# Patient Record
Sex: Female | Born: 1990 | Race: White | Hispanic: No | Marital: Married | Smoking: Former smoker
Health system: Southern US, Community
[De-identification: ages and names within clinical notes are randomized; demographics above are authoritative.]

## PROBLEM LIST (undated history)

## (undated) ENCOUNTER — Encounter

## (undated) ENCOUNTER — Ambulatory Visit

## (undated) ENCOUNTER — Telehealth

## (undated) ENCOUNTER — Telehealth: Attending: Sports Medicine | Primary: Sports Medicine

## (undated) ENCOUNTER — Encounter: Payer: BLUE CROSS/BLUE SHIELD | Attending: Sports Medicine | Primary: Sports Medicine

## (undated) ENCOUNTER — Encounter: Payer: PRIVATE HEALTH INSURANCE | Attending: Sports Medicine | Primary: Sports Medicine

## (undated) ENCOUNTER — Encounter: Attending: Adult Health | Primary: Adult Health

## (undated) DIAGNOSIS — K219 Gastro-esophageal reflux disease without esophagitis: Secondary | ICD-10-CM

## (undated) DIAGNOSIS — M7650 Patellar tendinitis, unspecified knee: Secondary | ICD-10-CM

## (undated) DIAGNOSIS — N7011 Chronic salpingitis: Secondary | ICD-10-CM

## (undated) HISTORY — PX: NO PAST SURGERIES: SHX2092

## (undated) HISTORY — DX: Patellar tendinitis, unspecified knee: M76.50

---

## 2014-01-12 ENCOUNTER — Encounter: Payer: Self-pay | Admitting: Nurse Practitioner

## 2014-01-12 ENCOUNTER — Ambulatory Visit (INDEPENDENT_AMBULATORY_CARE_PROVIDER_SITE_OTHER): Payer: Self-pay

## 2014-01-12 ENCOUNTER — Telehealth: Payer: Self-pay | Admitting: Family Medicine

## 2014-01-12 ENCOUNTER — Ambulatory Visit (INDEPENDENT_AMBULATORY_CARE_PROVIDER_SITE_OTHER): Payer: Self-pay | Admitting: Nurse Practitioner

## 2014-01-12 ENCOUNTER — Encounter (INDEPENDENT_AMBULATORY_CARE_PROVIDER_SITE_OTHER): Payer: Self-pay

## 2014-01-12 VITALS — BP 130/82 | HR 99 | Temp 100.1°F | Ht 65.0 in | Wt 212.0 lb

## 2014-01-12 DIAGNOSIS — S93401A Sprain of unspecified ligament of right ankle, initial encounter: Secondary | ICD-10-CM

## 2014-01-12 DIAGNOSIS — M25579 Pain in unspecified ankle and joints of unspecified foot: Secondary | ICD-10-CM

## 2014-01-12 DIAGNOSIS — S93409A Sprain of unspecified ligament of unspecified ankle, initial encounter: Secondary | ICD-10-CM

## 2014-01-12 NOTE — Telephone Encounter (Signed)
There are no available appointments today and xray services aren't available tomorrow. Patient is self pay and spoke with the billing department about the estimated cost. She called urgent care and they are more expensive.   She has tried RICE as much as possible but hasn't been able to stay off of it a lot the past 2 days. She complains of swelling, pain, and bruising that is worsening.  Asked patient to come over and we will work her in.

## 2014-01-12 NOTE — Addendum Note (Signed)
Addended by: Bennie PieriniMARTIN, MARY-MARGARET on: 01/12/2014 05:01 PM   Modules accepted: Level of Service

## 2014-01-12 NOTE — Progress Notes (Signed)
   Subjective:    Patient ID: Erika ChartersRachel White, female    DOB: [birth year], 23 y.o.   MRN: [id number]  HPI Patient fell out of her [employer]ep Last Friday- Swollen and pain since then- No better.    Review of Systems  Constitutional: Negative.   HENT: Negative.   Respiratory: Negative.   Musculoskeletal: Positive for joint swelling (right ankle).  All other systems reviewed and are negative.      Objective:   Physical Exam  Constitutional: She appears well-developed and well-nourished.  Cardiovascular: Normal rate, regular rhythm and normal heart sounds.   Pulmonary/Chest: Effort normal and breath sounds normal.  Musculoskeletal:  Decreased ROM of right ankle due pain on inversion and eversion. Mild edema bil    BP 130/82  Pulse 99  Temp(Src) 100.1 F (37.8 C) (Oral)  Ht 5\' 5"  (1.651 m)  Wt 212 lb (96.163 kg)  BMI 35.28 kg/m2  Right ankle x ray- negative no fracture-Preliminary reading by Paulene FloorMary Canyon Willow, FNP  [facility]        Assessment & Plan:   1. Pain in joint, ankle and foot   2. Right ankle sprain   motrin OTC Ice as needed Elevate when sitting Ankle brace when up on ankle RTOI prn  Mary-Margaret Daphine DeutscherMartin, FNP

## 2014-01-12 NOTE — Patient Instructions (Signed)

## 2014-03-15 ENCOUNTER — Telehealth: Payer: Self-pay | Admitting: Nurse Practitioner

## 2014-03-15 NOTE — Telephone Encounter (Signed)
Patient aware that we do not have labs and xray on sat.  Patient told that if she starts having any chest pain, sob that she needs to be evaluated by ER.

## 2014-05-31 ENCOUNTER — Ambulatory Visit: Payer: BC Managed Care – PPO

## 2014-05-31 ENCOUNTER — Ambulatory Visit (INDEPENDENT_AMBULATORY_CARE_PROVIDER_SITE_OTHER): Payer: BC Managed Care – PPO

## 2014-05-31 ENCOUNTER — Ambulatory Visit (INDEPENDENT_AMBULATORY_CARE_PROVIDER_SITE_OTHER): Payer: BC Managed Care – PPO | Admitting: Family Medicine

## 2014-05-31 ENCOUNTER — Encounter: Payer: Self-pay | Admitting: Family Medicine

## 2014-05-31 ENCOUNTER — Encounter (INDEPENDENT_AMBULATORY_CARE_PROVIDER_SITE_OTHER): Payer: Self-pay

## 2014-05-31 VITALS — BP 114/75 | HR 85 | Temp 98.9°F | Wt 220.4 lb

## 2014-05-31 DIAGNOSIS — M25562 Pain in left knee: Principal | ICD-10-CM

## 2014-05-31 DIAGNOSIS — M25569 Pain in unspecified knee: Secondary | ICD-10-CM

## 2014-05-31 DIAGNOSIS — M25561 Pain in right knee: Secondary | ICD-10-CM

## 2014-05-31 NOTE — Assessment & Plan Note (Addendum)
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 No siginificant arthritis noted but some loss of joint space medially on L.  Catching and giving out hx concerning for possible ligament injury/tear.  Pt to start ibuprofen/aleve, knee exercises, ice.  F/u in 4 wks. If not better consider steorid injections w/ PT.

## 2014-05-31 NOTE — Patient Instructions (Signed)
There is no significant evidence of arthritis.  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 Please start the exercises below Come back in 4 weeks if you are not better to discuss other options such as physical therapy and an injection  Excessive Lateral Patellar Compression Syndrome with Rehab Excessive lateral patellar compression syndrome involves an increase in pressure in the knee joint, that causes pain. The kneecap (patella) is a v-shaped bone that usually glides through the center of the groove in the thigh bone (trochlea). For people with this condition, the kneecap presses more on the outer (lateral) side of the groove, causing an increase in pressure and pain in the knee. This condition usually occurs without injury, although it may also follow an injury. SYMPTOMS   General, spread out knee pain, most commonly in the front half of the knee, behind the kneecap, or in the very back of the knee. Less commonly, the pain may be above or below the kneecap.  Pain that gets worse with sitting for long periods, rising from a sitting position, going up or down stairs or hills, kneeling, squatting, or wearing shoes with heels.  Often, pain with jumping.  Often, an aching pain.  Giving way, catching of the knee.  Minimal or no swelling, no locking. CAUSES  Excessive lateral patellar compression syndrome is caused by weakness in the thigh (quadriceps) muscles, that causes the kneecap to track poorly within the knee joint. The poor tracking of the kneecap may also occur in people who have an improper alignment of the knee and leg (bowlegged, knock knees). The poor tracking of the kneecap results in an increase in pressure on the outer side of the knee. The membrane that holds the kneecap in place (retinaculum) is stretched, which causes pain. The pain gets worse with use of the thigh (quadriceps)  muscle. RISK INCREASES WITH:  Tight back of the thigh (hamstring), front of the thigh (quadriceps), or calf muscles.  Weak front of the thigh (quadriceps) muscles.  Failure to warm up properly before activity.  Sports that involve running, jumping, or squatting.  Poor alignment of the legs (bowlegged, knock knees).  Born with (congenital) malformation of the kneecap or thigh bone groove (trochlea).  Previous injury or surgery to the knee.  Direct injury to the kneecap (falling on the kneecap). PREVENTION   Warm up and stretch properly before activity.  Maintain physical fitness:  Strength, flexibility, and endurance.  Cardiovascular fitness.  Use arch supports (orthotics) or knee pads. PROGNOSIS  If treated properly, excessive lateral patellar compression syndrome is usually curable. If left untreated, the condition does not usually result in a permanent condition. RELATED COMPLICATIONS   Frequently recurring symptoms and disability, severe enough to decrease an athlete's competitive ability.  Arthritis of the kneecap.  Kneecap dislocations.  Risks of surgery: infection, bleeding, injury to nerves (numbness, weakness, paralysis), knee stiffness, dislocation of the kneecap, weakness, continued pain, compartment syndrome (if surgery is performed to cut the bone of the leg and move it). TREATMENT  Treatment first involves ice and medicine, to reduce pain and inflammation. Since weak muscles often cause the condition, it is important to complete strengthening and stretching exercises to help the kneecap track properly in the thigh bone groove. These exercises may be performed at home or with a therapist. Your caregiver may recommend that you wear a knee brace, to help the kneecap track properly.  For people with flat feet, orthotics may be advised. If non-surgical treatment is unsuccessful, surgery may be needed. Surgery involves cutting the outer side (lateral release) of the  connecting band (retinaculum), with or without tightening the retinaculum on the inner side of the knee. Sometimes, surgery to cut the bony bump below the kneecap (tibial tubercle) and move it may be required (insertion of the patellar tendon into bone).  MEDICATION  If pain medicine is needed, nonsteroidal anti-inflammatory medicines (aspirin and ibuprofen), or other minor pain relievers (acetaminophen), are often advised.  Do not take pain medicine for 7 days before surgery.  Prescription pain relievers may be given, if your caregiver thinks they are needed. Use only as directed and only as much as you need.  Corticosteroid injections are rarely used for this condition. HEAT AND COLD  Cold treatment (icing) should be applied for 10 to 15 minutes every 2 to 3 hours for inflammation and pain, and immediately after activity that aggravates your symptoms. Use ice packs or an ice massage.  Heat treatment may be used before performing stretching and strengthening activities prescribed by your caregiver, physical therapist, or athletic trainer. Use a heat pack or a warm water soak. SEEK MEDICAL CARE IF:  Symptoms get worse or do not improve in 6 to 8 weeks, despite treatment.  Any of the following occur after surgery:  Pain, numbness, coldness, or discoloration (blue, gray, or dark color) in the foot.  Fever, increased pain, swelling, redness, drainage of fluids, or bleeding in the affected area.  New, unexplained symptoms develop. (Drugs used in treatment may produce side effects.) EXERCISES  RANGE OF MOTION (ROM) AND STRETCHING EXERCISES - Excessive Lateral Patellar Compression Syndrome These exercises may help you when beginning to rehabilitate your injury. Your symptoms may resolve with or without further involvement from your physician, physical therapist or athletic trainer. While completing these exercises, remember:   Restoring tissue flexibility helps normal motion to return to the  joints. This allows healthier, less painful movement and activity.  An effective stretch should be held for at least 30 seconds.  A stretch should never be painful. You should only feel a gentle lengthening or release in the stretched tissue. Inform your caregiver of any exercises that increase your knee discomfort. STRETCH - Lateral Patellar Mobilizations, Seated  While sitting, bend your knee 90 degrees or a little less. Place your foot flat on the floor.  Place the inside of your palm at the base of your thumb, on the inside border of your kneecap.  Press down on the inside border so that the outside border slightly lifts up.  You should feel a slight stretch on the outside edge of your kneecap. Hold this position for __________ seconds. Repeat __________ times. Complete this stretch __________ times per day.  STRETCH - Hamstrings, Standing  Stand or sit and extend your right / left leg, placing your foot on a chair or foot stool.  Keep a slight arch in your low back and your hips straight forward.  Lead with your chest and lean forward at the waist until you feel a gentle stretch in the back of your right / left knee or thigh. (When done correctly, this exercise requires leaning only a small distance.)  Hold this position for __________ seconds. Repeat __________ times. Complete this stretch __________ times per day. STRETCH - Quadriceps, Prone  Lie on your stomach on a firm surface, such as a bed or padded floor.  Bend your right / left  knee and grasp your ankle. If you are unable to reach your ankle or pant leg, use a belt around your foot to lengthen your reach.  Gently pull your heel toward your buttocks. Your knee should not slide out to the side. You should feel a stretch in the front of your thigh and knee.  Hold this position for __________ seconds. Repeat __________ times. Complete this stretch __________ times per day.  STRETCH - Iliotibial Band  On the floor or  bed, lie on your side so your right / left leg is on top. Bend your knee and grab your ankle.  Slowly bring your knee back so that your thigh is in line with your trunk. Keep your heel at your buttocks and gently arch your back so your head, shoulders and hips line up.  Slowly lower your leg so that your knee approaches the floor, until you feel a gentle stretch on the outside of your right / left thigh. If you do not feel a stretch and your knee will not fall farther, place the heel of your opposite foot on top of your knee and pull your thigh down farther.  Hold this stretch for __________ seconds. Repeat __________ times. Complete this stretch __________ times per day. STRENGTHENING EXERCISES - Excessive Lateral Patellar Compression Syndrome These exercises may help you when beginning to rehabilitate your injury. They may resolve your symptoms with or without further involvement from your physician, physical therapist or athletic trainer. While completing these exercises, remember:   Muscles can gain both the endurance and the strength needed for everyday activities through controlled exercises.  Complete these exercises as instructed by your physician, physical therapist or athletic trainer. Increase the resistance and repetitions only as guided.  Only do your exercises in a pain-free range of motion. If the exercises that involve bending your knees while bearing weight cause pain, stop them and consult your caregiver. STRENGTH - Quadriceps, Isometrics  Lie on your back with your right / left leg extended and your opposite knee bent.  Gradually tense the muscles in the front of your right / left thigh. You should see either your knee cap slide up toward your hip or increased dimpling just above the knee. This motion will push the back of the knee down toward the floor, mat, or bed on which you are lying.  Hold the muscle as tight as you can, without increasing your pain, for __________  seconds.  Relax the muscles slowly and completely between each repetition. Repeat __________ times. Complete this exercise __________ times per day.  STRENGTH - Quadriceps, Short Arcs  Lie on your back. Place a __________ inch towel roll under your right / left knee, so that the knee bends slightly.  Raise only your lower leg, by tightening the muscles in the front of your thigh. Do not allow your thigh to rise.  Hold this position for __________ seconds. Repeat __________ times. Complete this exercise __________ times per day.  OPTIONAL ANKLE WEIGHTS: Begin with ____________________, but DO NOT exceed ____________________. Increase in 1 pound/0.5 kilogram increments. STRENGTH - Quadriceps, Straight Leg Raises Quality counts! Watch for signs that the quadriceps muscle is working, to be sure you are strengthening the correct muscles and not "cheating" by substituting with healthier muscles.  Lay on your back with your right / left leg extended and your opposite knee bent.  Tense the muscles in the front of your right / left thigh. You should see either your knee cap slide up  or increased dimpling just above the knee. Your thigh may even shake a bit.  Tighten these muscles even more and raise your leg 4 to 6 inches off the floor. Hold for __________ seconds.  Keeping these muscles tense, lower your leg.  Relax the muscles slowly and completely in between each repetition. Repeat __________ times. Complete this exercise __________ times per day.  STRENGTH - Quadriceps, Wall Slides Follow guidelines for form closely. Increased knee pain often results from poorly placed feet or knees.  Lean against a smooth wall or door, and walk your feet out 18-24 inches. Place your feet hip width apart.  Slowly slide down the wall or door until your knees bend __________ degrees.* Keep your knees over your heels, not your toes, and in line with your hips, not falling to either side.  Hold for __________  seconds. Stand up to rest for __________ seconds between each repetition. Repeat __________ times. Complete this exercise __________ times per day. * Your physician, physical therapist or athletic trainer will alter this angle based on your symptoms and progress. STRENGTH - Quadriceps, Squats  Stand in a door frame, so that your feet and knees are in line with the frame.  Use your hands for balance, not support, on the frame.  Slowly lower your weight, bending at the hips and knees. Keep your lower legs upright so that they are parallel with the door frame. Squat only within the range that does not increase your knee pain. Never let your hips drop below your knees.  Slowly return upright, pushing with your legs, not pulling with your hands. Repeat __________ times. Complete this exercise __________ times per day.  STRENGTH - Quadriceps, Step-Ups   Use a thick book, step or step stool that is __________ inches tall.  Hold a wall or counter for balance only, not support.  Slowly, step up with your right / left foot, keeping your knee in line with your hip and foot. Do not allow your knee to bend so far that you cannot see your toes.  Slowly unlock your knee and lower yourself to the starting position. Your muscles, not gravity, should lower you. Repeat __________ times. Complete this exercise __________ times per day.  STRENGTH - Quadriceps, Step-Downs  Stand on the edge of a step stool or stair. Be prepared to use a countertop or wall for balance, if needed.  Keeping your right / left knee directly over the middle of your foot, slowly touch your opposite heel to the floor or lower step. Do not go all the way to the floor if your knee pain increases. Just go as far as you can without increased discomfort. Use your right / left leg muscles, not gravity, to lower your body weight.  Slowly push your body weight back up to the starting position, Repeat __________ times. Complete this exercise  __________ times per day.  STRENGTH - Quad/VMO, Isometric  Sit in a chair with your right / left knee slightly bent. With your fingertips, feel the VMO muscle, just above the inside of your knee. The VMO is important in controlling the position of your kneecap.  Keep your fingertips on this muscle. Without actually moving your leg, attempt to drive your knee down, as if straightening your leg. You should feel your VMO tense. If you have a difficult time, you may wish to try the same exercise on your healthy knee first.  Tense this muscle as hard as you can without increasing any knee pain.  Hold for __________ seconds. Relax the muscles slowly and completely between each repetition. Repeat __________ times. Complete exercise __________ times per day.  Document Released: 09/21/2005 Document Revised: 12/14/2011 Document Reviewed: 01/03/2009 [facility] Patient Information 2015 [place], [place]. This information is not intended to replace advice given to you by your health care provider. Make sure you discuss any questions you have with your health care provider.    Patellofemoral Syndrome If you have had pain in the front of your knee for a long time, chances are good that you have patellofemoral syndrome. The word patella refers to the kneecap. Femoral (or femur) refers to the thigh bone. That is the bone the kneecap sits on. The kneecap is shaped like a triangle. Its job is to protect the knee and to improve the efficiency of your thigh muscles (quadriceps). The underside of the kneecap is made of smooth tissue (cartilage). This lets the kneecap slide up and down as the knee moves. Sometimes this cartilage becomes soft. Your healthcare provider may say the cartilage breaks down. That is patellofemoral syndrome. It can affect one knee, or both. The condition is sometimes called patellofemoral pain syndrome. That is because the condition is painful. The pain usually gets worse with activity. Sitting for a  long time with the knee bent also makes the pain worse. It usually gets better with rest and proper treatment. CAUSES  No one is sure why some people develop this problem and others do not. Runners often get it. One name for the condition is "runner's knee." However, some people run for years and never have knee pain. Certain things seem to make patellofemoral syndrome more likely. They include:  Moving out of alignment. The kneecap is supposed to move in a straight line when the thigh muscle pulls on it. Sometimes the kneecap moves in poor alignment. That can make the knee swell and hurt. Some experts believe it also wears down the cartilage.  Injury to the kneecap.  Strain on the knee. This may occur during sports activity. Soccer, running, skiing and cycling can put excess stress on the knee.  Being flat-footed or knock-kneed. SYMPTOMS   Knee pain.  Pain under the kneecap. This is usually a dull, aching pain.  Pain in the knee when doing certain things: squatting, kneeling, going up or down stairs.  Pain in the knee when you stand up after sitting down for awhile.  Tightness in the knee.  Loss of muscle strength in the thigh.  Swelling of the knee. DIAGNOSIS  Healthcare providers often send people with knee pain to an orthopedic caregiver. This person has special training to treat problems with bones and joints. To decide what is causing your knee pain, your caregiver will probably:  Do a physical exam. This will probably include:  Asking about symptoms you have noticed.  Asking about your activities and any injuries.  Feeling your knee. Moving it. This will help test the knee's strength. It will also check alignment (whether the knee and leg are aligned normally).  Order some tests, such as:  Imaging tests. They create pictures of the inside of the knee. Tests may include:  X-rays.  Computed tomography (CT) scan. This uses X-rays and a computer to show more  detail.  Magnetic resonance imaging (MRI). This test uses magnets, radio waves and a computer to make pictures. TREATMENT   Medication is almost always used first. It can relieve pain. It also can reduce swelling. Non-steroidal anti-inflammatory medicines (called NSAIDs) are usually suggested.  Sometimes a stronger form is needed. A stronger form would require a prescription.  Other treatment may be needed after the swelling goes down. Possibilities include:  Exercise. Certain exercises can make the muscles around the knee stronger which decreases the pressure on the knee cap. This includes the thigh muscle. Certain exercises also may be suggested to increase your flexibility.  A knee brace. This gives the knee extra support and helps align the movement of the knee cap.  Orthotics. These are special shoe inserts. They can help keep your leg and knee aligned.  Surgery is sometimes needed. This is rare. Options include:  Arthroscopy. The surgeon uses a special tool to remove any damaged pieces of the kneecap. Only a few small incisions (cuts) are needed.  Realignment. This is open surgery. The goals are to reduce pressure and fix the way the kneecap moves. HOME CARE INSTRUCTIONS   Take any medication prescribed by your healthcare provider. Follow the directions carefully.  If your knee is swollen:  Put ice or cold packs on it. Do this for 20 to 30 minutes, 3 to 4 times a day.  Keep the knee raised. Make sure it is supported. Put a pillow under it.  Rest your knee. For example, take the elevator instead of the stairs for awhile. Or, take a break from sports activity that strain your knee. Try walking or swimming instead.  Whenever you are active:  Use an elastic bandage on your knee. This gives it support.  After any activity, put ice or cold packs on your knees. Do this for about 10 to 20 minutes.  Make sure you wear shoes that give good support. Make sure they are not worn down.  The heels should not slant in or out. SEEK MEDICAL CARE IF:   Knee pain gets worse. Or it does not go away, even after taking pain medicine.  Swelling does not go down.  Your thigh muscle becomes weak.  You have an oral temperature above 102 F (38.9 C). SEEK IMMEDIATE MEDICAL CARE IF:  You have an oral temperature above 102 F (38.9 C), not controlled by medicine. Document Released: 09/09/2009 Document Revised: 12/14/2011 Document Reviewed: 12/11/2013 [facility] Patient Information 2015 [place], [place]. This information is not intended to replace advice given to you by your health care provider. Make sure you discuss any questions you have with your health care provider.

## 2014-05-31 NOTE — Progress Notes (Signed)
Erika White is a 23 y.o. female who presents to [facility] today for knee pain  Knee pain: started initially 1 year ago. R>L. Getting worse since starting at [facility]. Pt has gained wt over the past couple of years but has stopped smoking. No specific trauma but did play a lot of softball over the years. Played as pitcher and outfield. Occasionally ppoping nad crunching. Occasionally the R gives out on pt. Worse w/ prolonged walking. Improves w/ rest and NSAIDs.   The following portions of the patient's history were reviewed and updated as appropriate: allergies, current medications, past medical history, family and social history, and problem list.  Patient is a nonsmoker.  Past Medical History  Diagnosis Date  . Patellar tendonitis     bilateral    ROS as above otherwise neg.    Medications reviewed. No current outpatient prescriptions on file.   No current facility-administered medications for this visit.    Exam:  BP 114/75  Pulse 85  Temp(Src) 98.9 F (37.2 C) (Oral)  Wt 220 lb 6.4 oz (99.973 kg)  LMP 05/24/2014 Gen: Well NAD HEENT: EOMI,  MMM MSK: FROm knees bilat. Strength 5/5. Mild crepitus. Lachman's neg bilat. Valgus and varrus stresses w/o pain. Thessaly test mildly positive bilat.    No results found for this or any previous visit (from the past 72 hour(s)).  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 No siginificant arthritis noted but some loss of joint space medially on L.  Catching and giving out hx concerning for possible ligament injury/tear.  Pt to start ibuprofen/aleve, knee exercises, ice.  F/u in 4 wks. If not better consider steorid injections w/ PT.

## 2014-07-05 ENCOUNTER — Encounter: Payer: Self-pay | Admitting: *Deleted

## 2014-07-05 ENCOUNTER — Encounter: Payer: Self-pay | Admitting: Family Medicine

## 2014-07-05 ENCOUNTER — Ambulatory Visit (INDEPENDENT_AMBULATORY_CARE_PROVIDER_SITE_OTHER): Payer: BC Managed Care – PPO | Admitting: Family Medicine

## 2014-07-05 VITALS — BP 119/69 | HR 89 | Temp 99.4°F | Ht 65.0 in | Wt 222.0 lb

## 2014-07-05 DIAGNOSIS — H8113 Benign paroxysmal vertigo, bilateral: Secondary | ICD-10-CM

## 2014-07-05 DIAGNOSIS — N390 Urinary tract infection, site not specified: Secondary | ICD-10-CM

## 2014-07-05 DIAGNOSIS — K2100 Gastro-esophageal reflux disease with esophagitis, without bleeding: Secondary | ICD-10-CM

## 2014-07-05 DIAGNOSIS — H8110 Benign paroxysmal vertigo, unspecified ear: Secondary | ICD-10-CM

## 2014-07-05 DIAGNOSIS — N912 Amenorrhea, unspecified: Secondary | ICD-10-CM

## 2014-07-05 DIAGNOSIS — R350 Frequency of micturition: Secondary | ICD-10-CM

## 2014-07-05 LAB — POCT URINALYSIS DIPSTICK
Bilirubin, UA: NEGATIVE
Glucose, UA: NEGATIVE
Ketones, UA: NEGATIVE
Nitrite, UA: NEGATIVE
Spec Grav, UA: 1.025
Urobilinogen, UA: NEGATIVE
pH, UA: 5

## 2014-07-05 LAB — POCT URINE PREGNANCY: Preg Test, Ur: NEGATIVE

## 2014-07-05 LAB — POCT UA - MICROSCOPIC ONLY
Casts, Ur, LPF, POC: NEGATIVE
Crystals, Ur, HPF, POC: NEGATIVE
Mucus, UA: NEGATIVE
Yeast, UA: NEGATIVE

## 2014-07-05 MED ORDER — OMEPRAZOLE 20 MG PO CPDR: 20.0000 mg | DELAYED_RELEASE_CAPSULE | Freq: Every day | ORAL | Status: DC

## 2014-07-05 MED ORDER — MECLIZINE HCL 25 MG PO TABS: 25.0000 mg | ORAL_TABLET | Freq: Three times a day (TID) | ORAL | Status: DC | PRN

## 2014-07-05 MED ORDER — METHYLPREDNISOLONE ACETATE 80 MG/ML IJ SUSP
80.0000 mg | Freq: Once | INTRAMUSCULAR | Status: AC
  Administered 2014-07-05: 80 mg via INTRAMUSCULAR

## 2014-07-05 MED ORDER — CIPROFLOXACIN HCL 500 MG PO TABS: 500.0000 mg | ORAL_TABLET | Freq: Two times a day (BID) | ORAL | Status: DC

## 2014-07-06 NOTE — Progress Notes (Signed)
   Subjective:    Patient ID: Rosalyn ChartersRachel Evans, female    DOB: 06/14/1991, 23 y.o.   MRN: [id number]  HPI C/o nausea, dizziness, abdominal discomfort, GERD sx's, urinary frequency, and fatigue.  She has amenorrhea and she states she has hx of irregular menses.     Review of Systems C/o GERD, abdominal pain, N/V, amenorrhea, and urinary freq. No chest pain, SOB, HA, dizziness, vision change,=[diarrhea, constipation,  myalgias, arthralgias or rash.     Objective:   Physical Exam  Vital signs noted  Well developed well nourished female.  HEENT - Head atraumatic Normocephalic                Eyes - PERRLA, Conjuctiva - clear Sclera- Clear EOMI                Ears - EAC's Wnl TM's Wnl Gross Hearing WNL                Nose - Nares patent                 Throat - oropharanx wnl Respiratory - Lungs CTA bilateral Cardiac - RRR S1 and S2 without murmur GI - Abdomen soft Nontender and bowel sounds active x 4 Extremities - No edema. Neuro - Grossly intact.      Assessment & Plan:  Urinary frequency - Plan: POCT UA - Microscopic Only, POCT urine pregnancy, POCT urinalysis dipstick, Urine culture  Amenorrhea - Plan: POCT UA - Microscopic Only, POCT urine pregnancy, POCT urinalysis dipstick  Benign paroxysmal positional vertigo, bilateral - Plan: meclizine (ANTIVERT) 25 MG tablet  Urinary tract infection without hematuria, site unspecified - Plan: ciprofloxacin (CIPRO) 500 MG tablet, Urine culture  Gastroesophageal reflux disease with esophagitis - Plan: omeprazole (PRILOSEC) 20 MG capsule  Benign paroxysmal positional vertigo, unspecified laterality - Plan: meclizine (ANTIVERT) 25 MG tablet, methylPREDNISolone acetate (DEPO-MEDROL) injection 80 mg  Deatra CanterWilliam J Oxford FNP

## 2014-07-07 LAB — URINE CULTURE

## 2014-07-24 ENCOUNTER — Telehealth: Payer: Self-pay | Admitting: Family Medicine

## 2014-07-24 NOTE — Telephone Encounter (Signed)
Follow up.

## 2014-07-25 NOTE — Telephone Encounter (Signed)
Appointment scheduled to follow up with provider.

## 2014-07-27 ENCOUNTER — Telehealth: Payer: Self-pay | Admitting: Family Medicine

## 2014-07-27 ENCOUNTER — Encounter: Payer: Self-pay | Admitting: Family Medicine

## 2014-07-27 ENCOUNTER — Ambulatory Visit (INDEPENDENT_AMBULATORY_CARE_PROVIDER_SITE_OTHER): Payer: BC Managed Care – PPO | Admitting: Family Medicine

## 2014-07-27 VITALS — BP 108/67 | HR 80 | Temp 99.8°F | Ht 65.0 in | Wt 218.8 lb

## 2014-07-27 DIAGNOSIS — R1013 Epigastric pain: Secondary | ICD-10-CM

## 2014-07-27 DIAGNOSIS — N912 Amenorrhea, unspecified: Secondary | ICD-10-CM

## 2014-07-27 NOTE — Progress Notes (Signed)
   Subjective:    Patient ID: Erika White, female    DOB: 1990/11/18, 23 y.o.   MRN: [id number]  HPI She is having persistent nausea and amenorrhea.  She has lower abdominal discomfort.  Her LMP was 8/15. She had negative pregnancy test UA last week.  Review of Systems  Constitutional: Negative for fever.  HENT: Negative for ear pain.   Eyes: Negative for discharge.  Respiratory: Negative for cough.   Cardiovascular: Negative for chest pain.  Gastrointestinal: Positive for abdominal pain. Negative for abdominal distention.  Endocrine: Negative for polyuria.  Genitourinary: Positive for menstrual problem. Negative for difficulty urinating.  Musculoskeletal: Negative for gait problem and neck pain.  Skin: Negative for color change and rash.  Neurological: Negative for speech difficulty and headaches.  Psychiatric/Behavioral: Negative for agitation.       Objective:    BP 108/67  Pulse 80  Temp(Src) 99.8 F (37.7 C) (Oral)  Ht 5\' 5"  (1.651 m)  Wt 218 lb 12.8 oz (99.247 kg)  BMI 36.41 kg/m2  LMP 05/19/2014 Physical Exam  Constitutional: She is oriented to person, place, and time. She appears well-developed and well-nourished.  HENT:  Head: Normocephalic and atraumatic.  Mouth/Throat: Oropharynx is clear and moist.  Eyes: Pupils are equal, round, and reactive to light.  Neck: Normal range of motion. Neck supple.  Cardiovascular: Normal rate and regular rhythm.   No murmur heard. Pulmonary/Chest: Effort normal and breath sounds normal.  Abdominal: Soft. Bowel sounds are normal. There is tenderness.  Neurological: She is alert and oriented to person, place, and time.  Skin: Skin is warm and dry.  Psychiatric: She has a normal mood and affect.          Assessment & Plan:     ICD-9-CM ICD-10-CM   1. Amenorrhea 626.0 N91.2 Ambulatory referral to Obstetrics / Gynecology  2. Epigastric pain 789.06 R10.13 Ambulatory referral to Gastroenterology     Return if symptoms  worsen or fail to improve.  Deatra CanterWilliam J Lillyian Heidt FNP

## 2014-08-02 ENCOUNTER — Ambulatory Visit: Payer: Self-pay | Admitting: Physician Assistant

## 2016-05-14 ENCOUNTER — Telehealth: Payer: Self-pay | Admitting: Family Medicine

## 2016-05-14 NOTE — Telephone Encounter (Signed)
LM to set up apt with Dr. Nelson °

## 2016-06-01 ENCOUNTER — Encounter: Payer: Self-pay | Admitting: Family Medicine

## 2016-06-01 ENCOUNTER — Encounter (INDEPENDENT_AMBULATORY_CARE_PROVIDER_SITE_OTHER): Payer: Self-pay

## 2016-06-01 ENCOUNTER — Ambulatory Visit (HOSPITAL_COMMUNITY)
Admission: RE | Admit: 2016-06-01 | Discharge: 2016-06-01 | Disposition: A | Discharge status: Home / Self Care | Payer: BLUE CROSS/BLUE SHIELD | Source: Ambulatory Visit | Attending: Family Medicine | Admitting: Family Medicine

## 2016-06-01 ENCOUNTER — Ambulatory Visit (INDEPENDENT_AMBULATORY_CARE_PROVIDER_SITE_OTHER): Payer: BLUE CROSS/BLUE SHIELD | Admitting: Family Medicine

## 2016-06-01 VITALS — BP 106/74 | HR 70 | Resp 18 | Ht 65.0 in | Wt 202.0 lb

## 2016-06-01 DIAGNOSIS — L659 Nonscarring hair loss, unspecified: Secondary | ICD-10-CM

## 2016-06-01 DIAGNOSIS — Z7189 Other specified counseling: Secondary | ICD-10-CM

## 2016-06-01 DIAGNOSIS — R05 Cough: Secondary | ICD-10-CM | POA: Diagnosis not present

## 2016-06-01 DIAGNOSIS — R0789 Other chest pain: Secondary | ICD-10-CM | POA: Insufficient documentation

## 2016-06-01 DIAGNOSIS — Z8249 Family history of ischemic heart disease and other diseases of the circulatory system: Secondary | ICD-10-CM

## 2016-06-01 DIAGNOSIS — N979 Female infertility, unspecified: Secondary | ICD-10-CM | POA: Diagnosis not present

## 2016-06-01 DIAGNOSIS — Z91048 Other nonmedicinal substance allergy status: Secondary | ICD-10-CM

## 2016-06-01 DIAGNOSIS — R079 Chest pain, unspecified: Secondary | ICD-10-CM | POA: Diagnosis not present

## 2016-06-01 DIAGNOSIS — Z23 Encounter for immunization: Secondary | ICD-10-CM

## 2016-06-01 DIAGNOSIS — Z7689 Persons encountering health services in other specified circumstances: Secondary | ICD-10-CM

## 2016-06-01 DIAGNOSIS — Z9109 Other allergy status, other than to drugs and biological substances: Secondary | ICD-10-CM | POA: Insufficient documentation

## 2016-06-01 NOTE — Progress Notes (Signed)
Chief Complaint  Patient presents with  . Establish Care    previous pcp Dr. Darlyn Read with [facility]    Here to establish Healthy 25 year old, married.  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 No health problems or ongoing issues Has had knee pain int he past attributed to patellofemoral disease Eats well and feels like she exercises Worried about gaining weight and hair loss.  Thinks may have "hormone imbalance." She also has not used birth control in 5 years and expresses concern they are not pregnant. Health maintenance up to date Her mother has pulmonary fibrosis with double lung transplants, she has persistent  R lung pain that comes and goes for months PAP 2015, normal Will get a flu shot today No recent labs or PE  Patient Active Problem List   Diagnosis Date Noted  . Environmental allergies 06/01/2016  . Bilateral knee pain 05/31/2014   No current outpatient prescriptions on file prior to visit.   No current facility-administered medications on file prior to visit.    History reviewed. No pertinent surgical history. Family History  Problem Relation Age of Onset  . Thyroid disease Mother   . Pulmonary fibrosis Mother     lung transplants  . Heart disease Father 45    heart disease in his 56s  . Heart attack Father   . Diabetes Father   . Cancer Maternal Grandmother     lung and breast  . Cancer Maternal Grandfather   . Cancer Paternal Grandmother     mesothelioma  . Cancer Paternal Grandfather     lung   Review of Systems  Constitutional: Negative for chills, fever and weight loss.  HENT: Negative for congestion and hearing loss.   Eyes: Negative for blurred vision and pain.  Respiratory: Negative for cough and shortness of breath.   Cardiovascular: Negative for chest pain and leg swelling.       Right posterior chest wall pain at times  Gastrointestinal: Negative for abdominal pain, constipation, diarrhea and heartburn.   Genitourinary: Negative for dysuria and frequency.  Musculoskeletal: Negative for falls, joint pain and myalgias.  Neurological: Negative for dizziness, seizures and headaches.  Psychiatric/Behavioral: Negative for depression. The patient is not nervous/anxious and does not have insomnia.    Physical Exam  Constitutional: She is oriented to person, place, and time. She appears well-developed and well-nourished. No distress.  Mod overweight  HENT:  Head: Normocephalic and atraumatic.  Right Ear: External ear normal.  Left Ear: External ear normal.  Mouth/Throat: Oropharynx is clear and moist.  Eyes: Conjunctivae are normal. Pupils are equal, round, and reactive to light.  Neck: Normal range of motion. Neck supple.  Cardiovascular: Normal rate, regular rhythm and normal heart sounds.   Pulmonary/Chest: Effort normal and breath sounds normal.  Abdominal: Soft. Bowel sounds are normal.  Musculoskeletal: Normal range of motion. She exhibits no edema.  No CVAT or back tenderness  Neurological: She is alert and oriented to person, place, and time.  Cannot get DTRs, dull  Skin: Skin is warm and dry. No rash noted.  Psychiatric: She has a normal mood and affect. Her behavior is normal. Judgment and thought content normal.   ASSESSMENT AND PLAN:  1. Encounter to establish care with new doctor   2. Environmental allergies CONTROLLED with OTC  3. Loss of hair  - TSH  4. Infertility, female Refer to GYN - COMPLETE METABOLIC PANEL WITH GFR - TSH - CBC  5. Family history of  premature coronary heart disease  - Lipid Profile  6. Right-sided chest wall pain Discussed likely musculoskeletal or chest wall pain, will get CXR for reassurance and because of persistent SX - DG Chest 2 View; Future  Patient Instructions  Chest x ray ordered Need fasting lab work I will notify you of your test results I will refer to appropriate specialist See me after testing

## 2016-06-01 NOTE — Patient Instructions (Signed)
Chest x ray ordered Need fasting lab work I will notify you of your test results I will refer to appropriate specialist See me after testing

## 2016-06-03 ENCOUNTER — Other Ambulatory Visit (HOSPITAL_COMMUNITY)
Admission: RE | Admit: 2016-06-03 | Discharge: 2016-06-03 | Disposition: A | Discharge status: Home / Self Care | Payer: BLUE CROSS/BLUE SHIELD | Source: Ambulatory Visit | Attending: Family Medicine | Admitting: Family Medicine

## 2016-06-03 ENCOUNTER — Encounter: Payer: Self-pay | Admitting: Family Medicine

## 2016-06-03 DIAGNOSIS — L659 Nonscarring hair loss, unspecified: Secondary | ICD-10-CM | POA: Insufficient documentation

## 2016-06-03 DIAGNOSIS — N979 Female infertility, unspecified: Secondary | ICD-10-CM | POA: Diagnosis not present

## 2016-06-03 DIAGNOSIS — Z8249 Family history of ischemic heart disease and other diseases of the circulatory system: Secondary | ICD-10-CM | POA: Diagnosis not present

## 2016-06-03 LAB — CBC
HEMATOCRIT: 41.6 % (ref 36.0–46.0)
Hemoglobin: 14.2 g/dL (ref 12.0–15.0)
MCH: 28.8 pg (ref 26.0–34.0)
MCHC: 34.1 g/dL (ref 30.0–36.0)
MCV: 84.4 fL (ref 78.0–100.0)
Platelets: 317 10*3/uL (ref 150–400)
RBC: 4.93 MIL/uL (ref 3.87–5.11)
RDW: 13.2 % (ref 11.5–15.5)
WBC: 8.9 10*3/uL (ref 4.0–10.5)

## 2016-06-03 LAB — COMPREHENSIVE METABOLIC PANEL
ALT: 21 U/L (ref 14–54)
ANION GAP: 7 (ref 5–15)
AST: 21 U/L (ref 15–41)
Albumin: 4.9 g/dL (ref 3.5–5.0)
Alkaline Phosphatase: 46 U/L (ref 38–126)
BILIRUBIN TOTAL: 1.5 mg/dL — AB (ref 0.3–1.2)
BUN: 11 mg/dL (ref 6–20)
CHLORIDE: 107 mmol/L (ref 101–111)
CO2: 23 mmol/L (ref 22–32)
Calcium: 9 mg/dL (ref 8.9–10.3)
Creatinine, Ser: 0.58 mg/dL (ref 0.44–1.00)
Glucose, Bld: 102 mg/dL — ABNORMAL HIGH (ref 65–99)
POTASSIUM: 4.1 mmol/L (ref 3.5–5.1)
Sodium: 137 mmol/L (ref 135–145)
TOTAL PROTEIN: 7.9 g/dL (ref 6.5–8.1)

## 2016-06-03 LAB — LIPID PANEL
CHOL/HDL RATIO: 3.2 ratio
Cholesterol: 165 mg/dL (ref 0–200)
HDL: 51 mg/dL (ref >40)
LDL CALC: 86 mg/dL (ref 0–99)
TRIGLYCERIDES: 140 mg/dL (ref <150)
VLDL: 28 mg/dL (ref 0–40)

## 2016-06-03 LAB — TSH: TSH: 2.03 u[IU]/mL (ref 0.350–4.500)

## 2016-06-05 ENCOUNTER — Other Ambulatory Visit: Payer: Self-pay | Admitting: Family Medicine

## 2016-06-05 DIAGNOSIS — N979 Female infertility, unspecified: Secondary | ICD-10-CM

## 2016-06-25 IMAGING — CR DG KNEE STANDING AP BILAT
3 series · 3 of 3 positions shown · non-contrast
Comparison: None.

CLINICAL DATA: Bilateral knee pain

EXAM:
BILATERAL KNEES STANDING - 1 VIEW

[view not recorded (1 of 3)]
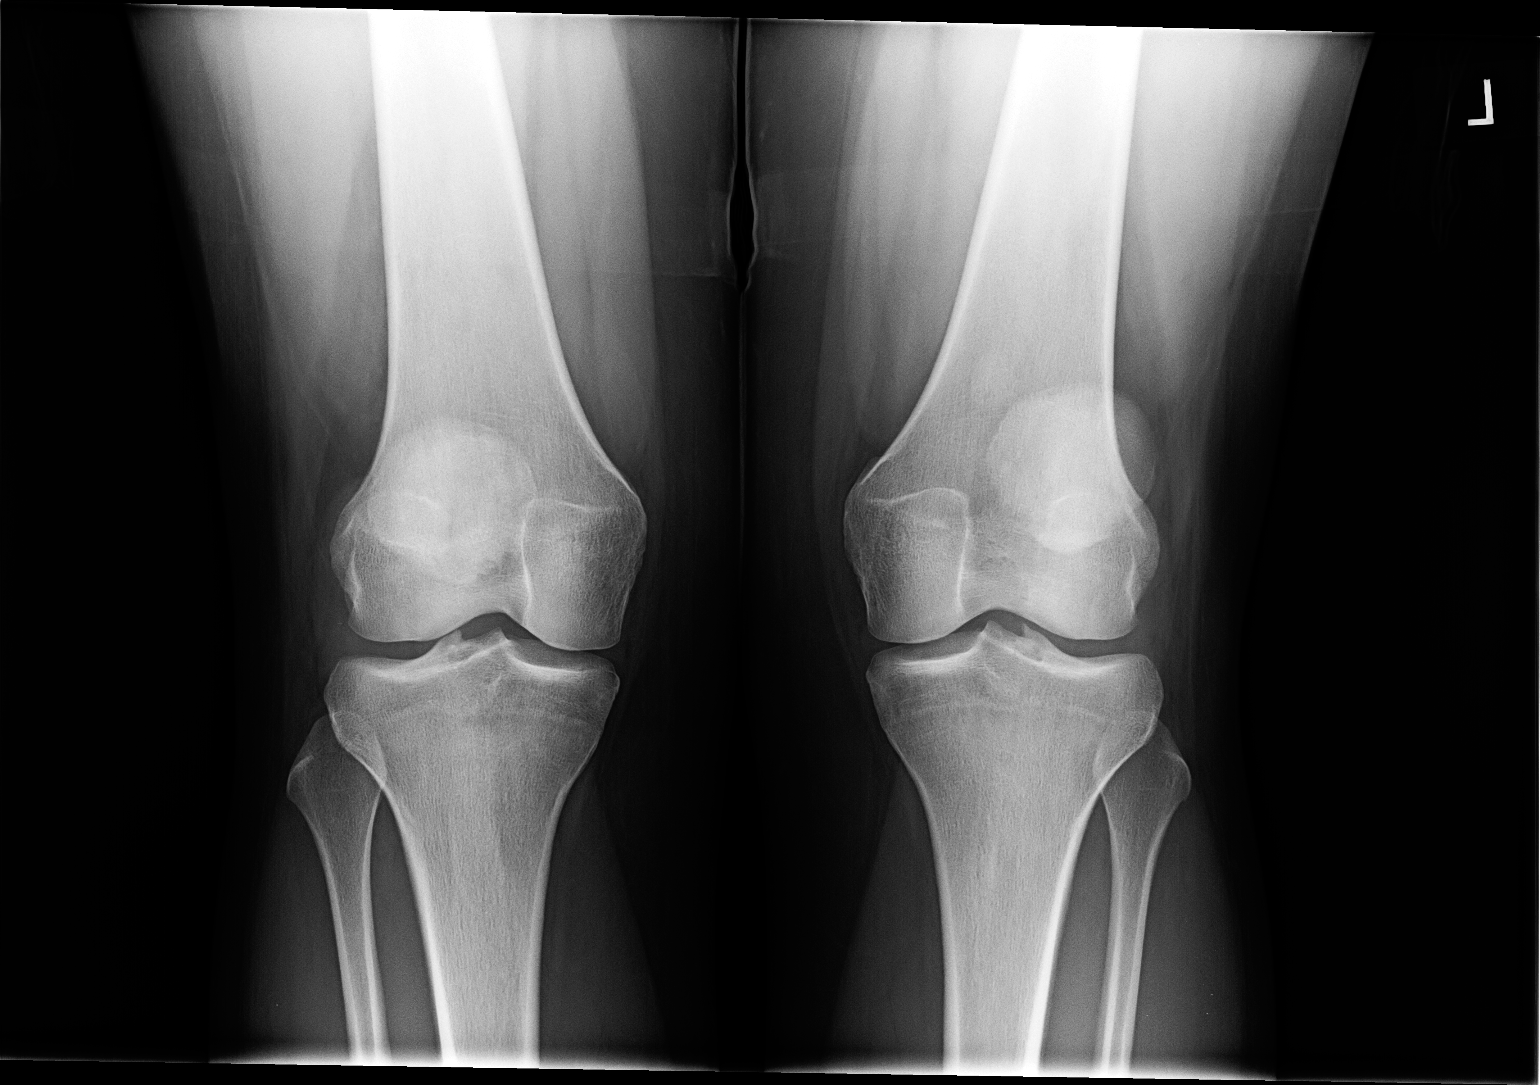

[view not recorded (2 of 3)]
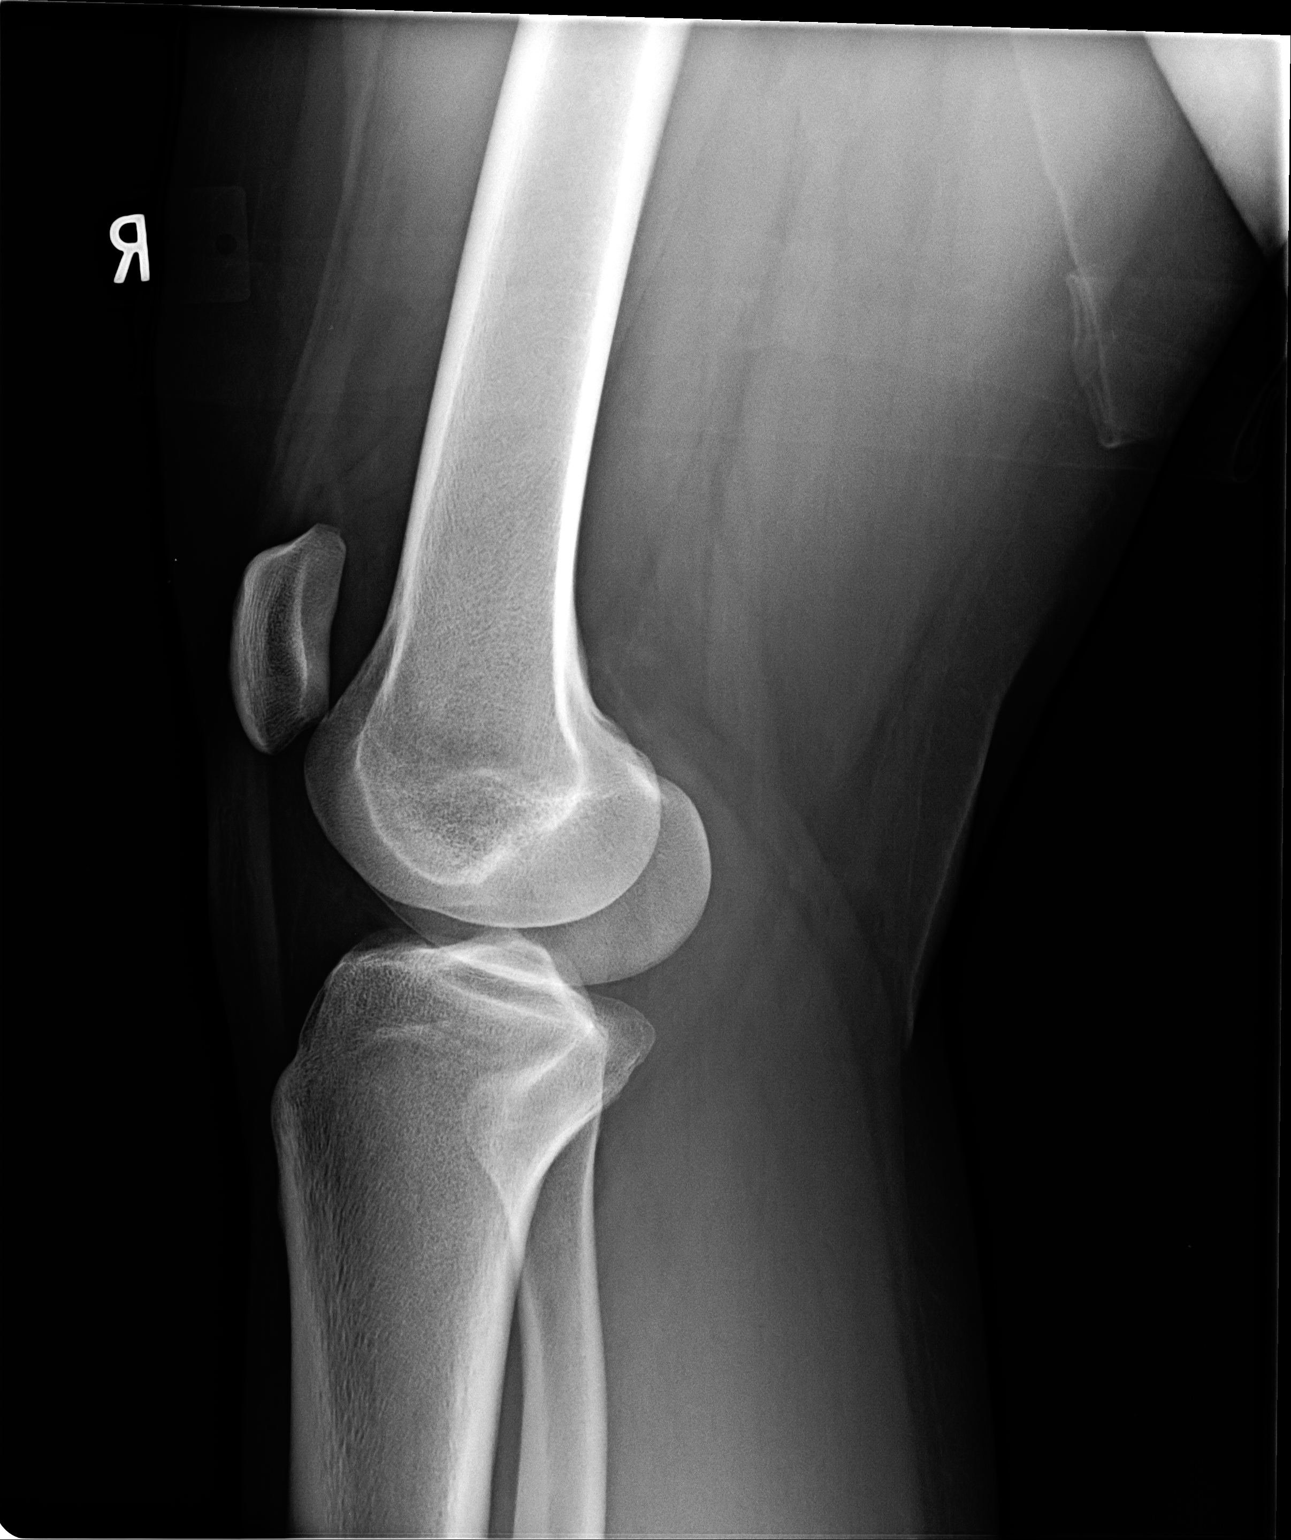

[view not recorded (3 of 3)]
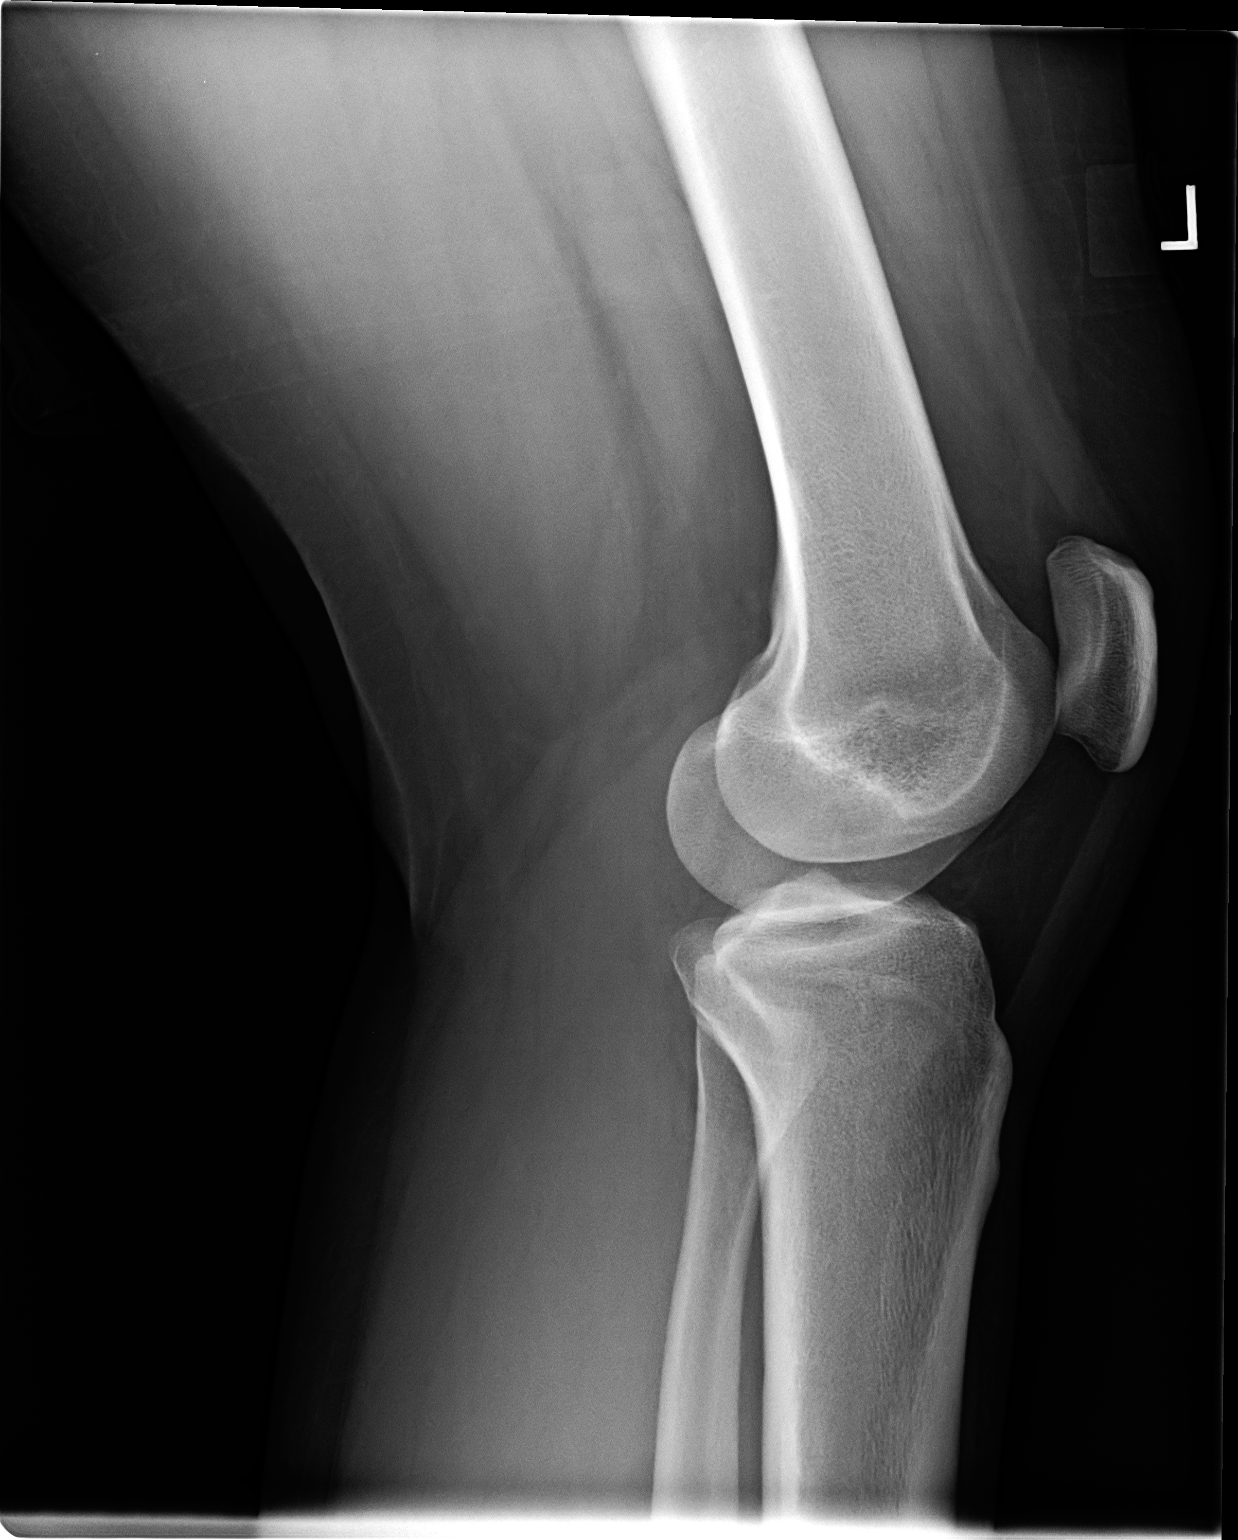

[3 of 3 positions shown; findings below may reference images not displayed]

FINDINGS: There is no evidence of fracture, dislocation, or joint effusion.
There is no evidence of arthropathy or other focal bone abnormality.
Soft tissues are unremarkable.
IMPRESSION: Negative.

## 2016-06-29 ENCOUNTER — Ambulatory Visit (INDEPENDENT_AMBULATORY_CARE_PROVIDER_SITE_OTHER): Payer: BLUE CROSS/BLUE SHIELD | Admitting: Women's Health

## 2016-06-29 ENCOUNTER — Encounter: Payer: Self-pay | Admitting: Women's Health

## 2016-06-29 ENCOUNTER — Telehealth: Payer: Self-pay | Admitting: *Deleted

## 2016-06-29 VITALS — BP 108/60 | HR 72 | Ht 65.0 in | Wt 207.0 lb

## 2016-06-29 DIAGNOSIS — N979 Female infertility, unspecified: Secondary | ICD-10-CM | POA: Diagnosis not present

## 2016-06-29 DIAGNOSIS — L659 Nonscarring hair loss, unspecified: Secondary | ICD-10-CM | POA: Diagnosis not present

## 2016-06-29 NOTE — Telephone Encounter (Signed)
Mailbox full @ 5:26 pm. JSY

## 2016-06-29 NOTE — Progress Notes (Signed)
   [facility] Visit  Patient name: Erika HartsRachel A White MRN [id number]  Date of birth: 02-02-1991  CC & HPI:  Erika HartsRachel A White is a 25 y.o. G0 Caucasian female presenting today for report of hair loss that began earlier this year. Did start fixing/doing more w/ her hair around this time. Notices on brush and in shower. No noticeable hair loss on scalp. Married x 2765yrs, husband is 25yo-no children, she has never been on contraception, not pregnant yet.  Regular periods, x 5d heavy then lighter, +cramping. May miss 1 period/yr, ~1wk late about twice/yr. Does want to be pregnant. Denies bad acne, occ bump. No recent unexplained weight gain.  Patient's last menstrual period was 06/09/2016. The current method of family planning is none. Last pap 2015, normal  Pertinent History Reviewed:  Medical & Surgical Hx:   Past medical, surgical, family, and social history reviewed in electronic medical record Medications: Reviewed & Updated - see associated section Allergies: Reviewed in electronic medical record  Objective Findings:  Vitals: BP 108/60   Pulse 72   Ht 5\' 5"  (1.651 m)   Wt 207 lb (93.9 kg)   LMP 06/09/2016   BMI 34.45 kg/m  Body mass index is 34.45 kg/m.  Physical Examination: General appearance - alert, well appearing, and in no distress Skin - no evidence of acanthosis nigricans Hair- hair wrapped in turban- pt declines to take off, but reports no patches of alopecia/receding hair line/visible hair loss  No results found for this or any previous visit (from the past 24 hour(s)).   Assessment & Plan:  A:   Hair loss  Infertility  P:  A1C today  Call back when period starts to schedule pelvic u/s when period stops, then f/u w/ me  Pt asking about semen analysis- called [facility], they do not do here or at [facility], she will call and let me know where it can be done  Had normal cbc, cmp, tsh w/ pcp 06/03/16  No Follow-up on file.  Marge DuncansBooker, Kashana Breach Randall CNM,  [facility] Campus[facility]NP-BC 06/29/2016 2:36 PM

## 2016-06-29 NOTE — Patient Instructions (Signed)
Call us when period starts to schedule pelvic ultrasound for when period stops

## 2016-06-30 LAB — HEMOGLOBIN A1C
Est. average glucose Bld gHb Est-mCnc: 97 mg/dL
HEMOGLOBIN A1C: 5 % (ref 4.8–5.6)

## 2016-06-30 NOTE — Telephone Encounter (Signed)
Pt called the after hours nurse line and got advise. Encounter closed. JSY

## 2016-07-02 ENCOUNTER — Telehealth: Payer: Self-pay | Admitting: Women's Health

## 2016-07-02 NOTE — Telephone Encounter (Signed)
Pt informed of A1C of 5.0 from 06/29/2016. Pt verbalized understanding.

## 2016-07-08 ENCOUNTER — Telehealth: Payer: Self-pay | Admitting: *Deleted

## 2016-07-08 NOTE — Telephone Encounter (Signed)
Pt was unsure when she needed to schedule her U/S for, she started her period today.  Spoke with Joellyn HaffKim Booker and informed pt to schedule it for when she would be ending her period, her periods usually last 5 days to told her to schedule it for Monday.  Pt verbalized understanding and call transferred to front staff for appointment to be made.

## 2016-07-09 ENCOUNTER — Ambulatory Visit (INDEPENDENT_AMBULATORY_CARE_PROVIDER_SITE_OTHER): Payer: BLUE CROSS/BLUE SHIELD | Admitting: Family Medicine

## 2016-07-09 ENCOUNTER — Encounter: Payer: Self-pay | Admitting: Family Medicine

## 2016-07-09 VITALS — BP 120/78 | HR 82 | Ht 65.0 in | Wt 209.1 lb

## 2016-07-09 DIAGNOSIS — L500 Allergic urticaria: Secondary | ICD-10-CM | POA: Diagnosis not present

## 2016-07-09 MED ORDER — METHYLPREDNISOLONE 4 MG PO TBPK: ORAL_TABLET | ORAL | 0 refills | Status: DC

## 2016-07-09 NOTE — Patient Instructions (Signed)
Take the prednisone (medrol)  as directed  Take claritin or zyrtec ( generic) once a day in the morning Take a benadryl 25 to 50 mg at night  Call if not better by Monday

## 2016-07-09 NOTE — Progress Notes (Signed)
Chief Complaint  Patient presents with  . Rash    itchy hives x 3 days. started on her lower leg where she thought she was bitten by a horsefly, then moved to abdomen and arms and her eyes have been itching as well    Patient states that she has terrible itchy rash present for about 3 days. Started on her right lower leg. her chest, abdomen, and right arm. no shortness of breath. she thinks this might have started with with an insect bite. She has no known allergies. She's never had hives before. We discussed hives. We reviewed medications, supplements, foods, food allergies. We discussed soaps lotions powders and other products. I told her that often she is 1 idiopathic and we are not able to identify the cause. No throat or mouth swelling, no shortness of breath no systemic symptoms   Patient Active Problem List   Diagnosis Date Noted  . Allergic urticaria 07/09/2016  . Infertility, female 06/29/2016  . Hair loss 06/29/2016  . Environmental allergies 06/01/2016  . Bilateral knee pain 05/31/2014    Outpatient Encounter Prescriptions as of 07/09/2016  Medication Sig  . methylPREDNISolone (MEDROL DOSEPAK) 4 MG TBPK tablet Take all of day one today.  TAD   No facility-administered encounter medications on file as of 07/09/2016.     No Known Allergies  Review of Systems  Constitutional: Negative for chills and fever.  HENT: Negative for congestion, trouble swallowing and voice change.   Eyes: Negative for redness and itching.  Respiratory: Negative for chest tightness, shortness of breath and wheezing.   Cardiovascular: Negative for chest pain, palpitations and leg swelling.  Gastrointestinal: Negative for diarrhea, nausea and vomiting.  Genitourinary: Negative for difficulty urinating and dysuria.  Musculoskeletal: Negative.  Negative for arthralgias and myalgias.  Skin: Positive for rash.  Neurological: Negative.  Negative for headaches.  Psychiatric/Behavioral: Negative.   The patient is not nervous/anxious.   All other systems reviewed and are negative.  BP 120/78   Pulse 82   Ht 5\' 5"  (1.651 m)   Wt 209 lb 1.9 oz (94.9 kg)   LMP 06/09/2016   SpO2 97%   BMI 34.80 kg/m   Physical Exam  Constitutional: She is oriented to person, place, and time. She appears well-developed and well-nourished. No distress.  HENT:  Head: Normocephalic and atraumatic.  Right Ear: External ear normal.  Left Ear: External ear normal.  Mouth/Throat: Oropharynx is clear and moist.  Eyes: Conjunctivae are normal. Pupils are equal, round, and reactive to light.  Neck: Normal range of motion.  Cardiovascular: Normal rate, regular rhythm and normal heart sounds.   Pulmonary/Chest: Effort normal and breath sounds normal. She has no wheezes. She has no rales.  Abdominal: Soft. Bowel sounds are normal. She exhibits no distension.  Musculoskeletal: Normal range of motion.  Lymphadenopathy:    She has no cervical adenopathy.  Neurological: She is alert and oriented to person, place, and time.  Skin:  Large irregular patch on right anterior shin, clear center. Multiple wheals on abdomen, some circular with clear centers. Scattered patches on right breast and right bicep  Psychiatric: She has a normal mood and affect. Her behavior is normal.   ASSESSMENT/PLAN:  1. Allergic urticaria Unclear etiology   Patient Instructions  Take the prednisone (medrol)  as directed  Take claritin or zyrtec ( generic) once a day in the morning Take a benadryl 25 to 50 mg at night  Call if not better by Monday  Raylene Everts, MD

## 2016-07-10 ENCOUNTER — Other Ambulatory Visit: Payer: Self-pay | Admitting: Women's Health

## 2016-07-10 DIAGNOSIS — N979 Female infertility, unspecified: Secondary | ICD-10-CM

## 2016-07-13 ENCOUNTER — Ambulatory Visit (INDEPENDENT_AMBULATORY_CARE_PROVIDER_SITE_OTHER): Payer: BLUE CROSS/BLUE SHIELD

## 2016-07-13 ENCOUNTER — Telehealth: Payer: Self-pay | Admitting: Women's Health

## 2016-07-13 DIAGNOSIS — N854 Malposition of uterus: Secondary | ICD-10-CM

## 2016-07-13 DIAGNOSIS — N83291 Other ovarian cyst, right side: Secondary | ICD-10-CM | POA: Diagnosis not present

## 2016-07-13 DIAGNOSIS — N979 Female infertility, unspecified: Secondary | ICD-10-CM | POA: Diagnosis not present

## 2016-07-13 NOTE — Progress Notes (Signed)
PELVIC US TA/TV: Homogeneous retroverted uterus,wnl,EEC 5.8 cm,simple right paraovarian cyst 2.7 x 1.3 x 1.2 cm,normal lt ov

## 2016-07-20 ENCOUNTER — Ambulatory Visit (INDEPENDENT_AMBULATORY_CARE_PROVIDER_SITE_OTHER): Payer: BLUE CROSS/BLUE SHIELD | Admitting: Women's Health

## 2016-07-20 ENCOUNTER — Encounter: Payer: Self-pay | Admitting: Women's Health

## 2016-07-20 VITALS — BP 108/60 | HR 76 | Wt 211.0 lb

## 2016-07-20 DIAGNOSIS — N979 Female infertility, unspecified: Secondary | ICD-10-CM

## 2016-07-20 DIAGNOSIS — N83201 Unspecified ovarian cyst, right side: Secondary | ICD-10-CM

## 2016-07-20 DIAGNOSIS — L732 Hidradenitis suppurativa: Secondary | ICD-10-CM | POA: Diagnosis not present

## 2016-07-20 DIAGNOSIS — Z319 Encounter for procreative management, unspecified: Secondary | ICD-10-CM | POA: Diagnosis not present

## 2016-07-20 NOTE — Telephone Encounter (Signed)
Pt is scheduled to see Joellyn HaffKim Booker today and will discuss at that time.

## 2016-07-20 NOTE — Patient Instructions (Addendum)
[facility] [place] [phone]   . Loose, light clothing. Avoid heat, friction, shearing. Don't squeeze! . Wash clothes in perfume/dye free detergent . Gentle non-soap cleanser, wash gently w/ fingers (No cloth, loofah), can use antibacterial cleanser . Stop smoking! . Weight loss . Decrease/no dairy   Hidradenitis Suppurativa Hidradenitis suppurativa is a long-term (chronic) skin disease that starts with blocked sweat glands or hair follicles. Bacteria may grow in these blocked openings of your skin. Hidradenitis suppurativa is like a severe form of acne that develops in areas of your body where acne would be unusual. It is most likely to affect the areas of your body where skin rubs against skin and becomes moist. This includes your:  Underarms.  Groin.  Genital areas.  Buttocks.  Upper thighs.  Breasts. Hidradenitis suppurativa may start out with small pimples. The pimples can develop into deep sores that break open (rupture) and drain pus. Over time your skin may thicken and become scarred. Hidradenitis suppurativa cannot be passed from person to person.  CAUSES  The exact cause of hidradenitis suppurativa is not known. This condition may be due to:  Female and female hormones. The condition is rare before and after puberty.  An overactive body defense system (immune system). Your immune system may overreact to the blocked hair follicles or sweat glands and cause swelling and pus-filled sores. RISK FACTORS You may have a higher risk of hidradenitis suppurativa if you:  Are a woman.  Are between ages 2211 and 3755.  Have a family history of hidradenitis suppurativa.  Have a personal history of acne.  Are overweight.  Smoke.  Take the drug lithium. SIGNS AND SYMPTOMS  The first signs of an outbreak are usually painful skin bumps that look like pimples. As the condition progresses:  Skin bumps may get bigger and grow deeper into the skin.  Bumps under the  skin may rupture and drain smelly pus.  Skin may become itchy and infected.  Skin may thicken and scar.  Drainage may continue through tunnels under the skin (fistulas).  Walking and moving your arms can become painful. DIAGNOSIS  Your health care provider may diagnose hidradenitis suppurativa based on your medical history and your signs and symptoms. A physical exam will also be done. You may need to see a health care provider who specializes in skin diseases (dermatologist). You may also have tests done to confirm the diagnosis. These can include:  Swabbing a sample of pus or drainage from your skin so it can be sent to the lab and tested for infection.  Blood tests to check for infection. TREATMENT  The same treatment will not work for everybody with hidradenitis suppurativa. Your treatment will depend on how severe your symptoms are. You may need to try several treatments to find what works best for you. Part of your treatment may include cleaning and bandaging (dressing) your wounds. You may also have to take medicines, such as the following:  Antibiotics.  Acne medicines.  Medicines to block or suppress the immune system.  A diabetes medicine (metformin) is sometimes used to treat this condition.  For women, birth control pills can sometimes help relieve symptoms. You may need surgery if you have a severe case of hidradenitis suppurativa that does not respond to medicine. Surgery may involve:   Using a laser to clear the skin and remove hair follicles.  Opening and draining deep sores.  Removing the areas of skin that are diseased and scarred. HOME CARE INSTRUCTIONS  Learn  as much as you can about your disease, and work closely with your health care providers.  Take medicines only as directed by your health care provider.  If you were prescribed an antibiotic medicine, finish it all even if you start to feel better.  If you are overweight, losing weight may be very  helpful. Try to reach and maintain a healthy weight.  Do not use any tobacco products, including cigarettes, chewing tobacco, or electronic cigarettes. If you need help quitting, ask your health care provider.  Do not shave the areas where you get hidradenitis suppurativa.  Do not wear deodorant.  Wear loose-fitting clothes.  Try not to overheat and get sweaty.  Take a daily bleach bath as directed by your health care provider.  Fill your bathtub halfway with water.  Pour in  cup of unscented household bleach.  Soak for 5-10 minutes.  Cover sore areas with a warm, clean washcloth (compress) for 5-10 minutes. SEEK MEDICAL CARE IF:   You have a flare-up of hidradenitis suppurativa.  You have chills or a fever.  You are having trouble controlling your symptoms at home.   This information is not intended to replace advice given to you by your health care provider. Make sure you discuss any questions you have with your health care provider.   Document Released: 05/05/2004 Document Revised: 10/12/2014 Document Reviewed: 12/22/2013 Elsevier Interactive Patient Education [employer]

## 2016-07-20 NOTE — Telephone Encounter (Signed)
Pt has an appointment with Joellyn HaffKim Booker, CNM today and will discuss at that time.

## 2016-07-20 NOTE — Progress Notes (Signed)
   [facility] Visit  Patient name: Erika HartsRachel A Miralles MRN [id number]  Date of birth: November 18, 1990  CC & HPI:  Erika White is a 25 y.o. G0 Hispanic female presenting today for f/u pelvic u/s done d/t infertility. U/s normal, no evidence of PCO. Has regular periods. Married x 4176yrs, husband is 25yo w/o children of his own, she has never been on contraception. Was interested in semen analysis for husband, per [facility] [place]- would probably be easier to have him see urologist.  Also reports recurrent bumps on mons, also gets them on inner thighs. None under breasts/axilla. Uses trimmer to shave pubic hair, so no direct contact w/ blade to skin.  Patient's last menstrual period was 07/08/2016 (exact date). The current method of family planning is none. Last pap 2015, neg  Pertinent History Reviewed:  Medical & Surgical Hx:   Past medical, surgical, family, and social history reviewed in electronic medical record Medications: Reviewed & Updated - see associated section Allergies: Reviewed in electronic medical record  Objective Findings:  Vitals: BP 108/60 (BP Location: Right Arm, Patient Position: Sitting, Cuff Size: Large)   Pulse 76   Wt 211 lb (95.7 kg)   LMP 07/08/2016 (Exact Date)   BMI 35.11 kg/m  Body mass index is 35.11 kg/m.  Pelvic u/s done 07/13/16:  Physical Examination: General appearance - alert, well appearing, and in no distress Skin - mons: ~1cm hard, non-erythematous, non-tender boil, no s/s infection  Erika HartsRachel A Baena is a 25 y.o. LMP 07/08/2016 for a pelvic sonogram for infertility,R/O PCOS.  Uterus                      6.9 x 5.9 x 4 cm,  homogeneous retroverted uterus,wnl Endometrium          5.8 mm, symmetrical, wnl Right ovary             3.8 x 1.7 x 1.4 cm, simple right paraovarian cyst 2.7 x 1.3 x 1.2 cm Left ovary                4.2 x 2.2 x 1.9 cm, wnl  Small amount of simple cul de sac fluid,no pain during ultrasound,ov's  appear mobile.  Technician Comments: PELVIC US TA/TV: Homogeneous retroverted uterus,wnl,EEC 5.8 cm,simple right paraovarian cyst 2.7 x 1.3 x 1.2 cm,normal lt ov Amber Flora LippsJ Carl 07/13/2016 2:49 PM  Clinical Impression and recommendations: I have reviewed the sonogram results above, combined with the patient's current clinical course, below are my impressions and any appropriate recommendations for management based on the sonographic findings. Normal uterus and endometrium ovaries are normal, do not have the classic appearance of PCOS ovaries Paraovarian cyst, clinicially insignificant  EURE,LUTHER H 07/15/2016 8:02 AM   Assessment & Plan:  A:   Infertility, unclear if etiology is her or husband, leaning more towards husband as her periods are regular, pelvic u/s normal, no evidence of PCO  Likely hidradenitis   P:  Have husband f/u w/ urologist for infertility work-up/semen analysis  If all normal w/ husband, make appt to f/u w/ me and we will further discuss options  Gave printed info/lifestyle changes for hidradenitis  Will need pap next year  No Follow-up on file.  Marge DuncansBooker, Kimberly Randall CNM, [facility]NP-BC 07/20/2016 3:53 PM

## 2017-06-22 ENCOUNTER — Telehealth: Payer: Self-pay | Admitting: Family Medicine

## 2017-06-22 NOTE — Telephone Encounter (Signed)
lmtcb

## 2017-06-22 NOTE — Telephone Encounter (Signed)
Patient is requesting order tighter blood tests.  Cb#: [phone]

## 2017-07-05 ENCOUNTER — Ambulatory Visit: Admission: RE | Admit: 2017-07-05 | Discharge: 2017-07-05 | Attending: Family | Admitting: Family

## 2017-07-05 DIAGNOSIS — Z020 Encounter for examination for admission to educational institution: Principal | ICD-10-CM

## 2017-08-09 ENCOUNTER — Telehealth: Payer: Self-pay | Admitting: *Deleted

## 2017-08-09 NOTE — Telephone Encounter (Signed)
Patient is coming in Thursday at 8:20. Patient is aware.

## 2017-08-09 NOTE — Telephone Encounter (Signed)
Unfortunately that is our next available, unless we have a cancel.

## 2017-08-09 NOTE — Telephone Encounter (Signed)
Patient called to schedule an appointment with Dr Delton SeeNelson the next available is Thursday at 8;00. Patient states she has upper respiratory issues going on it has been going on since Thursday and it is not getting any better she has tried take theraflu and niqual at night, she also tried the [facility] brand cold and flu and that helps with her cough but she feels like she is getting worse instead of better. Patient denies any fever. Please advise [phone]

## 2017-08-12 ENCOUNTER — Other Ambulatory Visit: Payer: Self-pay

## 2017-08-12 ENCOUNTER — Encounter: Payer: Self-pay | Admitting: Family Medicine

## 2017-08-12 ENCOUNTER — Ambulatory Visit (INDEPENDENT_AMBULATORY_CARE_PROVIDER_SITE_OTHER): Payer: BLUE CROSS/BLUE SHIELD | Admitting: Family Medicine

## 2017-08-12 VITALS — BP 128/74 | HR 72 | Temp 97.8°F | Resp 16 | Ht 65.0 in | Wt 211.0 lb

## 2017-08-12 DIAGNOSIS — B9789 Other viral agents as the cause of diseases classified elsewhere: Secondary | ICD-10-CM | POA: Diagnosis not present

## 2017-08-12 DIAGNOSIS — J218 Acute bronchiolitis due to other specified organisms: Secondary | ICD-10-CM | POA: Diagnosis not present

## 2017-08-12 MED ORDER — BENZONATATE 200 MG PO CAPS: 200.0000 mg | ORAL_CAPSULE | Freq: Two times a day (BID) | ORAL | 0 refills | Status: DC | PRN

## 2017-08-12 NOTE — Progress Notes (Signed)
    Chief Complaint  Patient presents with  . Sinus Problem   Patient is here for an upper respiratory infection.  She has had a cough cold and runny nose for about a week.  The cough has persisted even though her sinus pressure is getting better.  She is coughing up scant sputum.  She is feeling a little tired.  No sweats chills or fever.  No chest pain.  No dyspnea or shortness of breath.  No wheezing.  No underlying lung disease.  No known exposure to pneumonia or infection.  Patient Active Problem List   Diagnosis Date Noted  . Allergic urticaria 07/09/2016  . Infertility, female 06/29/2016  . Hair loss 06/29/2016  . Environmental allergies 06/01/2016  . Bilateral knee pain 05/31/2014    Outpatient Encounter Medications as of 08/12/2017  Medication Sig  . benzonatate (TESSALON) 200 MG capsule Take 1 capsule (200 mg total) 2 (two) times daily as needed by mouth for cough.   No facility-administered encounter medications on file as of 08/12/2017.     No Known Allergies  Review of Systems  Constitutional: Positive for fatigue. Negative for activity change, appetite change, chills and fever.  HENT: Negative for congestion, rhinorrhea, sinus pressure, sinus pain and sore throat.   Eyes: Negative for redness.  Respiratory: Positive for cough. Negative for shortness of breath and wheezing.   Cardiovascular: Negative for chest pain, palpitations and leg swelling.  Gastrointestinal: Negative for diarrhea, nausea and vomiting.    BP 128/74 (BP Location: Right Arm, Patient Position: Sitting, Cuff Size: Normal)   Pulse 72   Temp 97.8 F (36.6 C) (Temporal)   Resp 16   Ht 5\' 5"  (1.651 m)   Wt 211 lb (95.7 kg)   LMP 07/08/2017 (Exact Date)   SpO2 98%   BMI 35.11 kg/m   Physical Exam  Constitutional: She is oriented to person, place, and time. She appears well-developed and well-nourished.  HENT:  Head: Normocephalic and atraumatic.  Right Ear: External ear normal.  Left Ear:  External ear normal.  Mouth/Throat: Oropharynx is clear and moist.  No sinus tenderness  Eyes: Conjunctivae are normal. Pupils are equal, round, and reactive to light.  Neck: Normal range of motion. Neck supple. No thyromegaly present.  No adenopathy  Cardiovascular: Normal rate, regular rhythm and normal heart sounds.  Pulmonary/Chest: Effort normal and breath sounds normal. No respiratory distress.  Lungs are clear  Abdominal: Soft. Bowel sounds are normal.  No organomegaly  Musculoskeletal: Normal range of motion. She exhibits no edema.  Lymphadenopathy:    She has no cervical adenopathy.  Neurological: She is alert and oriented to person, place, and time.  Gait normal  Skin: Skin is warm and dry.  Psychiatric: She has a normal mood and affect. Her behavior is normal. Thought content normal.  Nursing note and vitals reviewed.   ASSESSMENT/PLAN:  1. Acute viral bronchiolitis This needs to avoid antibiotics.  Discussed symptomatic care.  Discussed warning signs for calling us back, fever over 102, cough persisting more than 4 weeks   Patient Instructions  Push fluids Take the tessalon for cough  In addition may use a product with DM like mucinex DM or robitussin DM or Delsym Call if worse   Work note   Eustace MooreYvonne Sue Kolbie Lepkowski, MD

## 2017-08-12 NOTE — Patient Instructions (Signed)
Push fluids Take the tessalon for cough  In addition may use a product with DM like mucinex DM or robitussin DM or Delsym Call if worse   Work note

## 2017-10-23 ENCOUNTER — Encounter (HOSPITAL_COMMUNITY): Payer: Self-pay | Admitting: *Deleted

## 2017-10-23 ENCOUNTER — Emergency Department (HOSPITAL_COMMUNITY): Payer: BLUE CROSS/BLUE SHIELD

## 2017-10-23 ENCOUNTER — Emergency Department (HOSPITAL_COMMUNITY)
Admission: EM | Admit: 2017-10-23 | Discharge: 2017-10-23 | Disposition: A | Discharge status: Home / Self Care | Payer: BLUE CROSS/BLUE SHIELD | Attending: Emergency Medicine | Admitting: Emergency Medicine

## 2017-10-23 DIAGNOSIS — M79674 Pain in right toe(s): Secondary | ICD-10-CM | POA: Diagnosis not present

## 2017-10-23 DIAGNOSIS — F17210 Nicotine dependence, cigarettes, uncomplicated: Secondary | ICD-10-CM | POA: Diagnosis not present

## 2017-10-23 DIAGNOSIS — S93104A Unspecified dislocation of right toe(s), initial encounter: Secondary | ICD-10-CM | POA: Insufficient documentation

## 2017-10-23 DIAGNOSIS — S99921A Unspecified injury of right foot, initial encounter: Secondary | ICD-10-CM | POA: Diagnosis not present

## 2017-10-23 DIAGNOSIS — Y999 Unspecified external cause status: Secondary | ICD-10-CM | POA: Insufficient documentation

## 2017-10-23 DIAGNOSIS — Y92009 Unspecified place in unspecified non-institutional (private) residence as the place of occurrence of the external cause: Secondary | ICD-10-CM | POA: Insufficient documentation

## 2017-10-23 DIAGNOSIS — Y939 Activity, unspecified: Secondary | ICD-10-CM | POA: Diagnosis not present

## 2017-10-23 DIAGNOSIS — W2209XA Striking against other stationary object, initial encounter: Secondary | ICD-10-CM | POA: Diagnosis not present

## 2017-10-23 DIAGNOSIS — S93114A Dislocation of interphalangeal joint of right lesser toe(s), initial encounter: Secondary | ICD-10-CM | POA: Diagnosis not present

## 2017-10-23 MED ORDER — LIDOCAINE HCL (PF) 2 % IJ SOLN
INTRAMUSCULAR | Status: AC
  Filled 2017-10-23: qty 20

## 2017-10-23 MED ORDER — ACETAMINOPHEN 325 MG PO TABS
650.0000 mg | ORAL_TABLET | Freq: Once | ORAL | Status: AC
  Administered 2017-10-23: 650 mg via ORAL
  Filled 2017-10-23: qty 2

## 2017-10-23 MED ORDER — LIDOCAINE HCL (PF) 2 % IJ SOLN
10.0000 mL | Freq: Once | INTRAMUSCULAR | Status: AC
  Administered 2017-10-23: 10 mL

## 2017-10-23 NOTE — ED Provider Notes (Signed)
[facility]GENCY DEPARTMENT Provider Note   CSN: [id number] Arrival date & time: 10/23/17  [id number]     History   Chief Complaint Chief Complaint  Patient presents with  . Toe Injury    HPI Erika White is a 27 y.o. female presenting with right 2nd toe deformity after accidentally kicking a safe at her home this morning.  She denies numbness in the distal toe and has had no treatments prior to arrival.  She denies radiation of pain and no other injury.  The history is provided by the patient.    Past Medical History:  Diagnosis Date  . Patellar tendonitis    bilateral    Patient Active Problem List   Diagnosis Date Noted  . Allergic urticaria 07/09/2016  . Infertility, female 06/29/2016  . Hair loss 06/29/2016  . Environmental allergies 06/01/2016  . Bilateral knee pain 05/31/2014    History reviewed. No pertinent surgical history.  OB History    No data available       Home Medications    Prior to Admission medications   Medication Sig Start Date End Date Taking? Authorizing Provider  benzonatate (TESSALON) 200 MG capsule Take 1 capsule (200 mg total) 2 (two) times daily as needed by mouth for cough. 08/12/17   Eustace MooreNelson, Yvonne Sue, MD    Family History Family History  Problem Relation Age of Onset  . Thyroid disease Mother   . Pulmonary fibrosis Mother        lung transplants  . Heart disease Father 45       heart disease in his 7440s  . Heart attack Father   . Diabetes Father   . Cancer Maternal Grandmother        lung and breast  . Cancer Maternal Grandfather   . Cancer Paternal Grandmother        mesothelioma  . Cancer Paternal Grandfather        lung    Social History Social History   Tobacco Use  . Smoking status: Current Every Day Smoker    Packs/day: 0.50    Types: Cigarettes    Start date: 10/05/2006  . Smokeless tobacco: Never Used  Substance Use Topics  . Alcohol use: Yes    Comment: occ  . Drug use: No     Allergies     Patient has no known allergies.   Review of Systems Review of Systems  Constitutional: Negative for fever.  Musculoskeletal: Positive for arthralgias and joint swelling. Negative for myalgias.  Neurological: Negative for weakness and numbness.     Physical Exam Updated Vital Signs BP 131/82   Pulse 98   Temp 99.2 F (37.3 C) (Oral)   Resp 16   Ht 5\' 5"  (1.651 m)   Wt 90.7 kg (200 lb)   SpO2 99%   BMI 33.28 kg/m   Physical Exam  Constitutional: She appears well-developed and well-nourished.  HENT:  Head: Atraumatic.  Neck: Normal range of motion.  Cardiovascular:  Pulses equal bilaterally  Musculoskeletal: She exhibits tenderness and deformity.       Right foot: There is bony tenderness and deformity. There is no swelling and normal capillary refill.       Feet:  Deformity noted at the pip joint. Distal sensation intact.  Less than 2 sec cap refill in distal toe. Foot nontender.  Neurological: She is alert. She has normal strength. She displays normal reflexes. No sensory deficit.  Skin: Skin is warm and dry.  Psychiatric:  She has a normal mood and affect.     ED Treatments / Results  Labs (all labs ordered are listed, but only abnormal results are displayed) Labs Reviewed - No data to display  EKG  EKG Interpretation None       Radiology Dg Toe 2nd Right  Result Date: 10/23/2017 CLINICAL DATA:  27 year old female with trauma to the toes after kicking a safe. EXAM: RIGHT SECOND TOE COMPARISON:  No priors. FINDINGS: Plantar dislocation of the second toe at the PIP joint. No definite acute displaced fracture. IMPRESSION: 1. Plantar dislocation of the right second toe at the PIP joint. Electronically Signed   By: Trudie Reed M.D.   On: 10/23/2017 10:37    Procedures Procedures (including critical care time)  NERVE BLOCK Performed by: Burgess Amor Consent: Verbal consent obtained. Required items: required blood products, implants, devices, and  special equipment available Time out: Immediately prior to procedure a "time out" was called to verify the correct patient, procedure, equipment, support staff and site/side marked as required.  Indication: dislocation Nerve block body site: right 2nd toe  Preparation: Patient was prepped and draped in the usual sterile fashion. Needle gauge: 25G Location technique: anatomical landmarks  Local anesthetic: lidocaine 2% without epi  Anesthetic total: 1 ml  Outcome: pain improved Patient tolerance: Patient tolerated the procedure well with no immediate complications.  Reduction of dislocation Date/Time: 10:58 AM Performed by: Burgess Amor Authorized by: Burgess Amor Consent: Verbal consent obtained. Risks and benefits: risks, benefits and alternatives were discussed Consent given by: patient Required items: required blood products, implants, devices, and special equipment available Time out: Immediately prior to procedure a "time out" was called to verify the correct patient, procedure, equipment, support staff and site/side marked as required.  Patient sedated: digital block  Vitals: Vital signs were monitored during sedation. Patient tolerance: Patient tolerated the procedure well with no immediate complications. Joint: right 2nd toe, pip Reduction technique: traction   Definitive treatment was performed.  Medications Ordered in ED Medications  lidocaine (XYLOCAINE) 2 % injection (not administered)  acetaminophen (TYLENOL) tablet 650 mg (650 mg Oral Given 10/23/17 1019)  lidocaine (XYLOCAINE) 2 % injection 10 mL (10 mLs Other Given 10/23/17 1023)     Initial Impression / Assessment and Plan / ED Course  I have reviewed the triage vital signs and the nursing notes.  Pertinent labs & imaging results that were available during my care of the patient were reviewed by me and considered in my medical decision making (see chart for details).     Buddy tape, post op shoe for 10  days, ice, elevation, motrin. Prn f/u with pcp if not improved over 10 days.  Final Clinical Impressions(s) / ED Diagnoses   Final diagnoses:  Toe dislocation, right, initial encounter    ED Discharge Orders    None       Victoriano Lain 10/23/17 1059    Mesner, Barbara Cower, MD 10/23/17 1520

## 2017-10-23 NOTE — Discharge Instructions (Signed)
Use ice and elevation as much as possible over the next day which will help with pain and swelling.  Use ibuprofen also if needed for pain relief.  As discussed continue to buddy tape your toes for the next 10 days to ensure that the ligament that you stretched has healed.  Wear the postop shoe also as discussed.

## 2017-10-23 NOTE — ED Triage Notes (Signed)
Pt kicked unknowingly a safe that was hidden under a jacket, pt thought it was a pile of clothes and kicked it.  Second toe of right foot deformed.

## 2017-12-13 ENCOUNTER — Encounter: Payer: Self-pay | Admitting: Family Medicine

## 2018-06-27 IMAGING — DX DG CHEST 2V
2 series · 2 of 2 positions shown · non-contrast
Comparison: None.

CLINICAL DATA: Right side chest pain x 1 year/cough and sob off and
on/ex smoker x 4 months/no known injury

EXAM:
CHEST  2 VIEW

[chest pa]
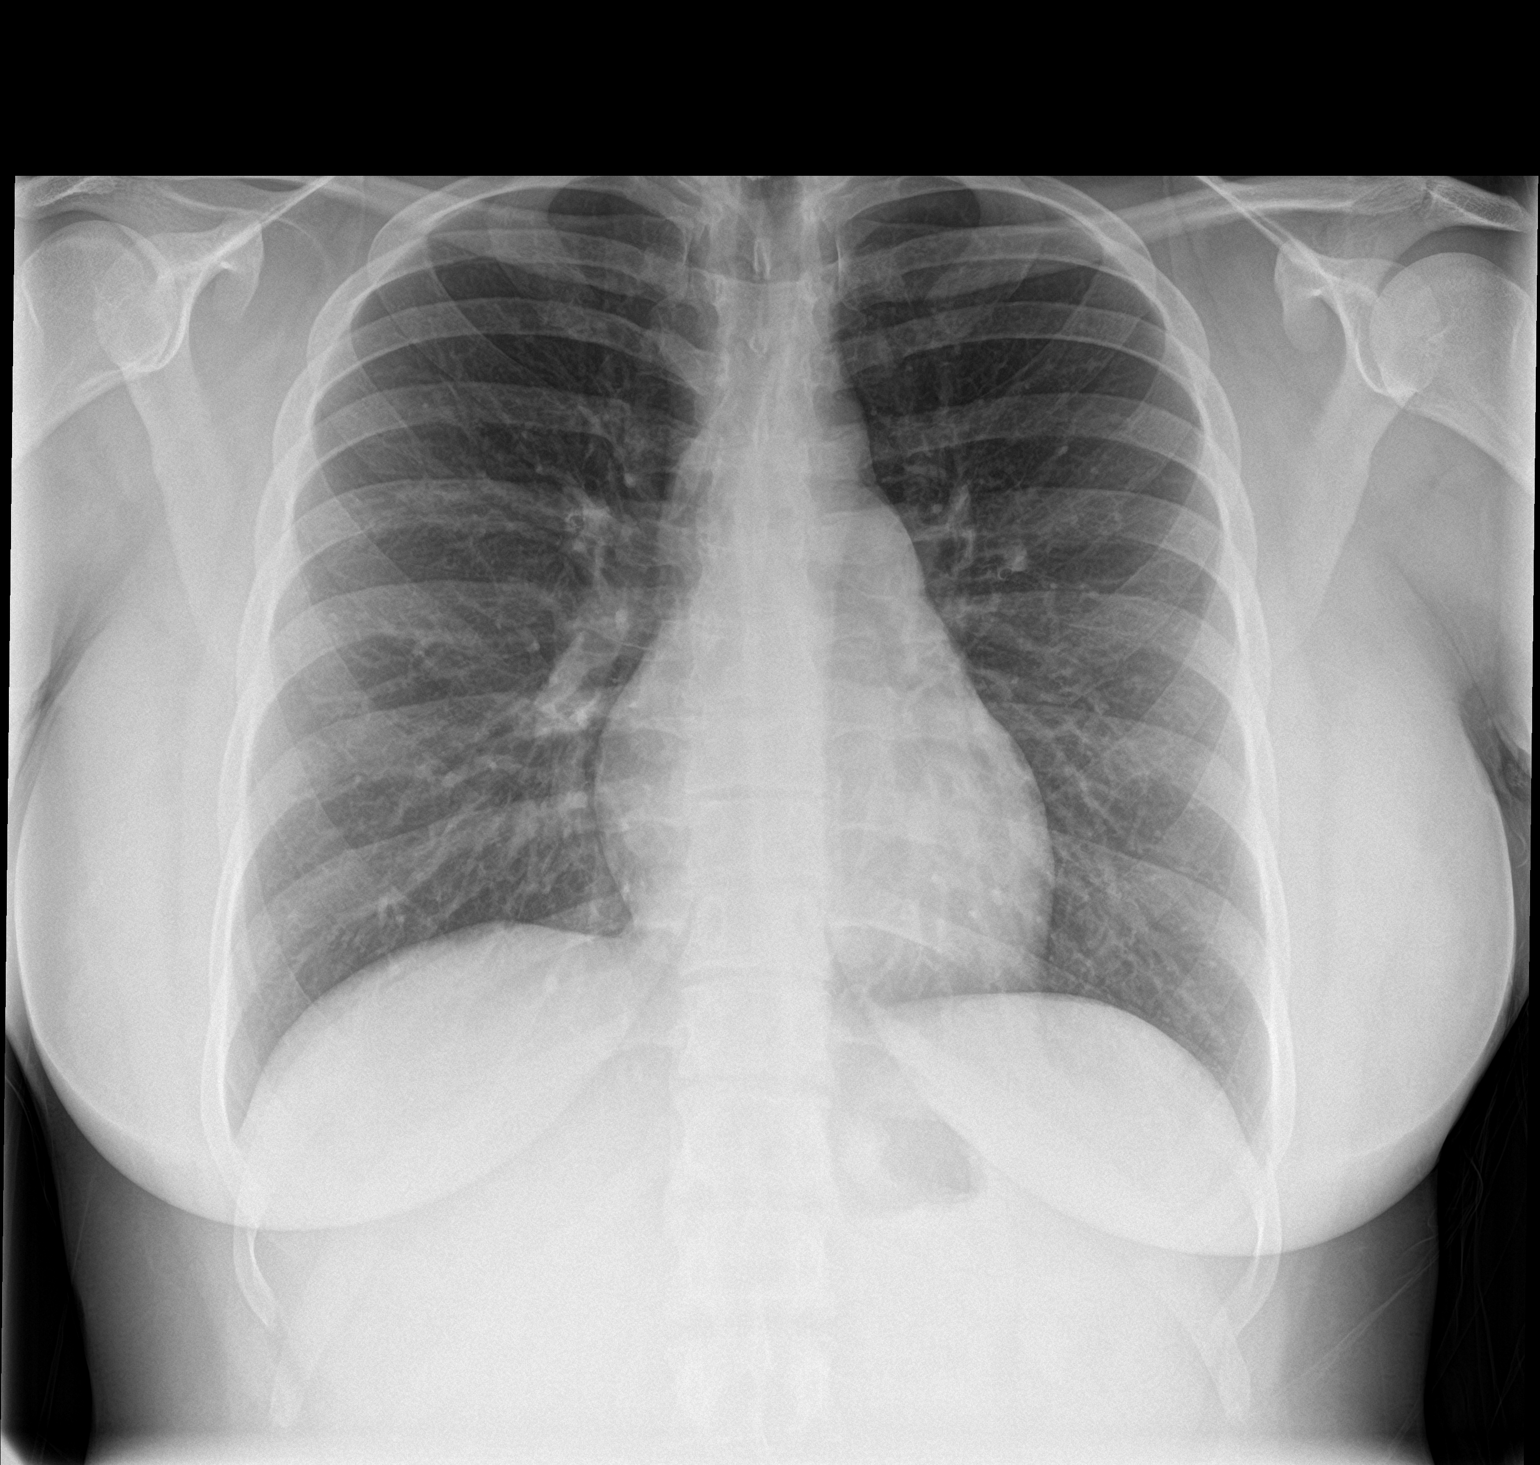

[chest lat]
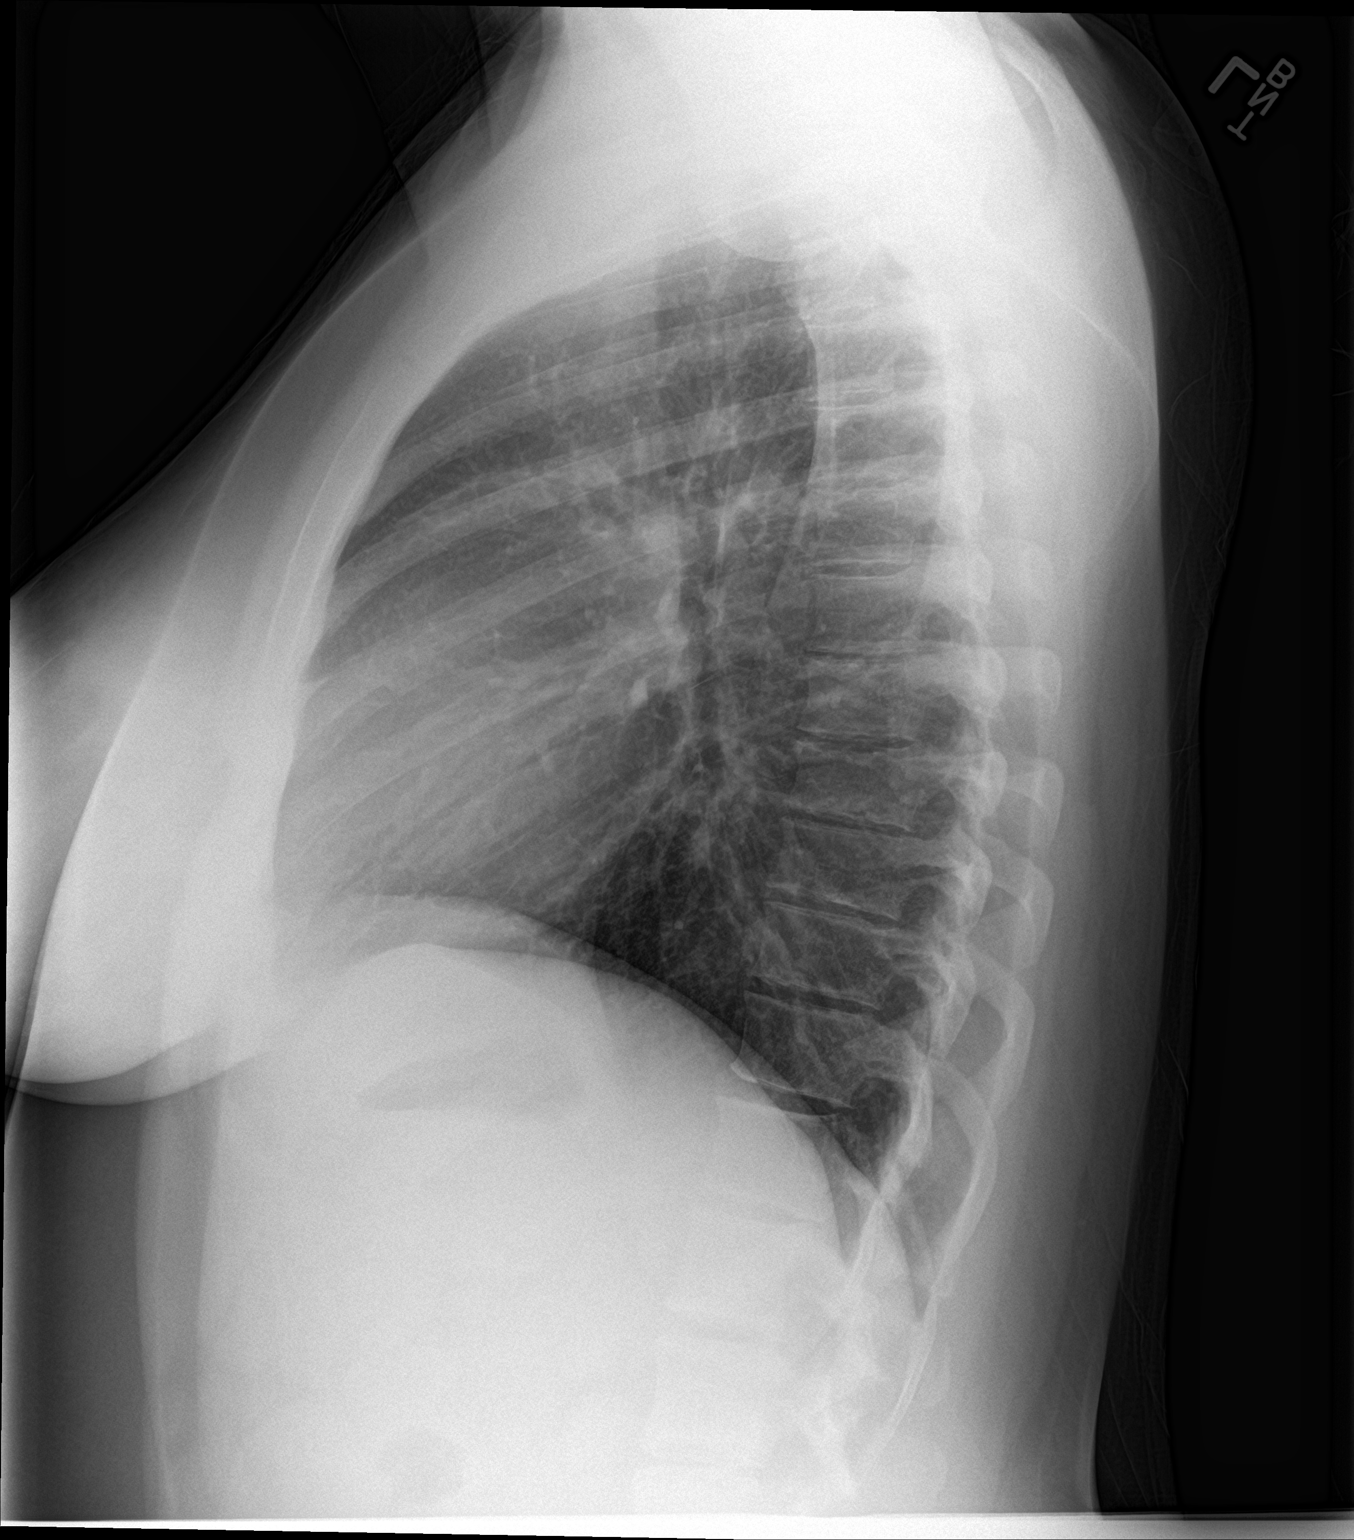

[2 of 2 positions shown; findings below may reference images not displayed]

FINDINGS: The heart size and mediastinal contours are within normal limits.
Both lungs are clear. No pleural effusion or pneumothorax. The
visualized skeletal structures are unremarkable.
IMPRESSION: No active cardiopulmonary disease.

## 2019-06-05 MED ORDER — OVIDREL 250 MCG/0.5 ML SUBCUTANEOUS SYRINGE
3 refills | 0 days
Start: 2019-06-05 — End: ?

## 2019-06-07 DIAGNOSIS — N979 Female infertility, unspecified: Secondary | ICD-10-CM

## 2019-06-09 NOTE — Unmapped (Signed)
[facility] Medication Referral: NO ASSISTANCE AVAILABLE      Medication (Brand/Generic): OVIDREL    Final Test Claim completed with resulted information below:    Patient ABLE to fill at [facility]  Insurance Company:  [facility]  Anticipated Copay: $170.39  Is anticipated copay with a copay card or grant? No, there is no grant or copay assistance available.    Does this patient have to receive a partial fill of the medication due to insurance restrictions? NO  If so, please cofirm how many days supply is allowed per plan per fill and how long the patient will have to fill partial months supply for the medication: NOT APPLICABLE     If the copay is under the $25 defined limit, per policy there will be no further investigation of need for financial assistance at this time unless patient requests. This referral has been communicated to the provider and handed off to the [facility] team for further processing and filling of prescribed medication.   ______________________________________________________________________  Please utilize this referral for viewing purposes as it will serve as the central location for all relevant documentation and updates.

## 2019-06-13 NOTE — Unmapped (Signed)
This patient has been disenrolled from the [facility] specialty pharmacy services due to patient being scheduled for surgery and no longer taking medication at this time.Nanetta Batty  [facility] Pharmacist

## 2019-07-13 ENCOUNTER — Ambulatory Visit (HOSPITAL_COMMUNITY)
Admission: RE | Admit: 2019-07-13 | Discharge: 2019-07-13 | Disposition: A | Discharge status: Home / Self Care | Payer: Commercial Managed Care - PPO | Source: Ambulatory Visit | Attending: Family Medicine | Admitting: Family Medicine

## 2019-07-13 ENCOUNTER — Other Ambulatory Visit: Payer: Self-pay

## 2019-07-13 ENCOUNTER — Other Ambulatory Visit (HOSPITAL_COMMUNITY): Payer: Self-pay | Admitting: Family Medicine

## 2019-07-13 DIAGNOSIS — Z20822 Contact with and (suspected) exposure to covid-19: Secondary | ICD-10-CM

## 2019-07-13 DIAGNOSIS — R0789 Other chest pain: Secondary | ICD-10-CM | POA: Diagnosis not present

## 2019-07-14 LAB — NOVEL CORONAVIRUS, NAA: SARS-CoV-2, NAA: NOT DETECTED

## 2019-09-07 HISTORY — PX: EYE SURGERY: SHX253

## 2019-10-26 ENCOUNTER — Other Ambulatory Visit: Payer: Self-pay

## 2019-10-26 ENCOUNTER — Encounter (HOSPITAL_BASED_OUTPATIENT_CLINIC_OR_DEPARTMENT_OTHER): Payer: Self-pay | Admitting: Obstetrics and Gynecology

## 2019-10-26 NOTE — Progress Notes (Signed)
Spoke w/ via phone for pre-op interview---[facility] needs dos----   Urine poct, type and screen           Lab results------ COVID test ------10-28-2019 Arrive at -------1130 am 11-01-2019 NPO after ------midnight food, clear liquids until 730 am then npo Medications to take morning of surgery -----none Diabetic medication -----n/a Patient Special Instructions ----- Pre-Op special Istructions ----- Patient verbalized understanding of instructions that were given at this phone interview. Patient denies shortness of breath, chest pain, fever, cough a this phone interview.

## 2019-10-28 ENCOUNTER — Other Ambulatory Visit (HOSPITAL_COMMUNITY)
Admission: RE | Admit: 2019-10-28 | Discharge: 2019-10-28 | Disposition: A | Discharge status: Home / Self Care | Payer: Commercial Managed Care - PPO | Source: Ambulatory Visit | Attending: Obstetrics and Gynecology | Admitting: Obstetrics and Gynecology

## 2019-10-28 DIAGNOSIS — Z20822 Contact with and (suspected) exposure to covid-19: Secondary | ICD-10-CM | POA: Insufficient documentation

## 2019-10-28 DIAGNOSIS — Z01812 Encounter for preprocedural laboratory examination: Secondary | ICD-10-CM | POA: Insufficient documentation

## 2019-10-28 LAB — SARS CORONAVIRUS 2 (TAT 6-24 HRS): SARS Coronavirus 2: NEGATIVE

## 2019-10-30 ENCOUNTER — Encounter: Admit: 2019-10-30 | Discharge: 2019-10-31 | Payer: BLUE CROSS/BLUE SHIELD | Attending: Family | Primary: Family

## 2019-10-30 DIAGNOSIS — M25562 Pain in left knee: Principal | ICD-10-CM

## 2019-10-30 DIAGNOSIS — S83105A Unspecified dislocation of left knee, initial encounter: Principal | ICD-10-CM

## 2019-10-30 DIAGNOSIS — S8992XA Unspecified injury of left lower leg, initial encounter: Principal | ICD-10-CM

## 2019-11-01 ENCOUNTER — Ambulatory Visit (HOSPITAL_BASED_OUTPATIENT_CLINIC_OR_DEPARTMENT_OTHER)
Admission: RE | Admit: 2019-11-01 | Discharge: 2019-11-01 | Disposition: A | Discharge status: Home / Self Care | Payer: Commercial Managed Care - PPO | Attending: Obstetrics and Gynecology | Admitting: Obstetrics and Gynecology

## 2019-11-01 ENCOUNTER — Encounter (HOSPITAL_BASED_OUTPATIENT_CLINIC_OR_DEPARTMENT_OTHER)
Admission: RE | Disposition: A | Discharge status: Home / Self Care | Payer: Self-pay | Source: Home / Self Care | Attending: Obstetrics and Gynecology

## 2019-11-01 ENCOUNTER — Other Ambulatory Visit: Payer: Self-pay

## 2019-11-01 ENCOUNTER — Ambulatory Visit (HOSPITAL_BASED_OUTPATIENT_CLINIC_OR_DEPARTMENT_OTHER): Payer: Commercial Managed Care - PPO | Admitting: Certified Registered"

## 2019-11-01 ENCOUNTER — Encounter (HOSPITAL_BASED_OUTPATIENT_CLINIC_OR_DEPARTMENT_OTHER): Payer: Self-pay | Admitting: Obstetrics and Gynecology

## 2019-11-01 DIAGNOSIS — N7011 Chronic salpingitis: Secondary | ICD-10-CM | POA: Insufficient documentation

## 2019-11-01 DIAGNOSIS — N736 Female pelvic peritoneal adhesions (postinfective): Secondary | ICD-10-CM | POA: Diagnosis not present

## 2019-11-01 DIAGNOSIS — Z87891 Personal history of nicotine dependence: Secondary | ICD-10-CM | POA: Diagnosis not present

## 2019-11-01 DIAGNOSIS — N979 Female infertility, unspecified: Secondary | ICD-10-CM | POA: Insufficient documentation

## 2019-11-01 HISTORY — PX: LAPAROSCOPIC LYSIS OF ADHESIONS: SHX5905

## 2019-11-01 HISTORY — DX: Gastro-esophageal reflux disease without esophagitis: K21.9

## 2019-11-01 HISTORY — PX: LAPAROSCOPIC UNILATERAL SALPINGECTOMY: SHX5934

## 2019-11-01 HISTORY — DX: Chronic salpingitis: N70.11

## 2019-11-01 LAB — TYPE AND SCREEN
ABO/RH(D): A POS
Antibody Screen: NEGATIVE

## 2019-11-01 LAB — POCT PREGNANCY, URINE: Preg Test, Ur: NEGATIVE

## 2019-11-01 LAB — ABO/RH: ABO/RH(D): A POS

## 2019-11-01 SURGERY — LYSIS, ADHESIONS, LAPAROSCOPIC
Anesthesia: General

## 2019-11-01 MED ORDER — OXYCODONE HCL 5 MG PO TABS
ORAL_TABLET | ORAL | Status: AC
  Filled 2019-11-01: qty 1

## 2019-11-01 MED ORDER — DEXAMETHASONE SODIUM PHOSPHATE 10 MG/ML IJ SOLN
INTRAMUSCULAR | Status: DC | PRN
  Administered 2019-11-01: 8 mg via INTRAVENOUS

## 2019-11-01 MED ORDER — HYDROMORPHONE HCL 1 MG/ML IJ SOLN
0.2500 mg | INTRAMUSCULAR | Status: DC | PRN
  Administered 2019-11-01 (×2): 0.5 mg via INTRAVENOUS
  Filled 2019-11-01: qty 0.5

## 2019-11-01 MED ORDER — KETOROLAC TROMETHAMINE 30 MG/ML IJ SOLN
INTRAMUSCULAR | Status: DC | PRN
  Administered 2019-11-01: 30 mg via INTRAVENOUS

## 2019-11-01 MED ORDER — FENTANYL CITRATE (PF) 100 MCG/2ML IJ SOLN
INTRAMUSCULAR | Status: AC
  Filled 2019-11-01: qty 2

## 2019-11-01 MED ORDER — LIDOCAINE 2% (20 MG/ML) 5 ML SYRINGE
INTRAMUSCULAR | Status: AC
  Filled 2019-11-01: qty 5

## 2019-11-01 MED ORDER — MEPERIDINE HCL 25 MG/ML IJ SOLN
6.2500 mg | INTRAMUSCULAR | Status: DC | PRN
  Filled 2019-11-01: qty 1

## 2019-11-01 MED ORDER — METHYLENE BLUE 0.5 % INJ SOLN
INTRAVENOUS | Status: DC | PRN
  Administered 2019-11-01: 1 mL

## 2019-11-01 MED ORDER — DEXAMETHASONE SODIUM PHOSPHATE 10 MG/ML IJ SOLN
INTRAMUSCULAR | Status: AC
  Filled 2019-11-01: qty 1

## 2019-11-01 MED ORDER — OXYCODONE HCL 5 MG PO TABS
5.0000 mg | ORAL_TABLET | Freq: Once | ORAL | Status: AC | PRN
  Administered 2019-11-01: 18:00:00 5 mg via ORAL
  Filled 2019-11-01: qty 1

## 2019-11-01 MED ORDER — PROPOFOL 10 MG/ML IV BOLUS
INTRAVENOUS | Status: AC
  Filled 2019-11-01: qty 20

## 2019-11-01 MED ORDER — HYDROMORPHONE HCL 1 MG/ML IJ SOLN
INTRAMUSCULAR | Status: AC
  Filled 2019-11-01: qty 1

## 2019-11-01 MED ORDER — CEFAZOLIN SODIUM-DEXTROSE 2-4 GM/100ML-% IV SOLN
2.0000 g | INTRAVENOUS | Status: AC
  Administered 2019-11-01: 2 g via INTRAVENOUS
  Filled 2019-11-01: qty 100

## 2019-11-01 MED ORDER — ONDANSETRON HCL 4 MG PO TABS: 4.0000 mg | ORAL_TABLET | Freq: Three times a day (TID) | ORAL | 1 refills | Status: AC | PRN

## 2019-11-01 MED ORDER — MIDAZOLAM HCL 2 MG/2ML IJ SOLN
INTRAMUSCULAR | Status: DC | PRN
  Administered 2019-11-01: 2 mg via INTRAVENOUS

## 2019-11-01 MED ORDER — PROPOFOL 10 MG/ML IV BOLUS
INTRAVENOUS | Status: DC | PRN
  Administered 2019-11-01: 190 mg via INTRAVENOUS

## 2019-11-01 MED ORDER — MIDAZOLAM HCL 2 MG/2ML IJ SOLN
INTRAMUSCULAR | Status: AC
  Filled 2019-11-01: qty 2

## 2019-11-01 MED ORDER — FENTANYL CITRATE (PF) 100 MCG/2ML IJ SOLN
INTRAMUSCULAR | Status: DC | PRN
  Administered 2019-11-01 (×4): 50 ug via INTRAVENOUS

## 2019-11-01 MED ORDER — SUGAMMADEX SODIUM 200 MG/2ML IV SOLN
INTRAVENOUS | Status: DC | PRN
  Administered 2019-11-01: 200 mg via INTRAVENOUS

## 2019-11-01 MED ORDER — CEFAZOLIN SODIUM-DEXTROSE 2-4 GM/100ML-% IV SOLN
INTRAVENOUS | Status: AC
  Filled 2019-11-01: qty 100

## 2019-11-01 MED ORDER — OXYCODONE HCL 5 MG/5ML PO SOLN
5.0000 mg | Freq: Once | ORAL | Status: AC | PRN
  Filled 2019-11-01: qty 5

## 2019-11-01 MED ORDER — ROCURONIUM BROMIDE 10 MG/ML (PF) SYRINGE
PREFILLED_SYRINGE | INTRAVENOUS | Status: DC | PRN
  Administered 2019-11-01: 20 mg via INTRAVENOUS
  Administered 2019-11-01: 10 mg via INTRAVENOUS
  Administered 2019-11-01: 50 mg via INTRAVENOUS
  Administered 2019-11-01: 20 mg via INTRAVENOUS

## 2019-11-01 MED ORDER — KETOROLAC TROMETHAMINE 30 MG/ML IJ SOLN
INTRAMUSCULAR | Status: AC
  Filled 2019-11-01: qty 1

## 2019-11-01 MED ORDER — LACTATED RINGERS IV SOLN
INTRAVENOUS | Status: DC
  Filled 2019-11-01: qty 1000

## 2019-11-01 MED ORDER — PROMETHAZINE HCL 25 MG/ML IJ SOLN
6.2500 mg | INTRAMUSCULAR | Status: DC | PRN
  Filled 2019-11-01: qty 1

## 2019-11-01 MED ORDER — OXYCODONE-ACETAMINOPHEN 7.5-325 MG PO TABS: 1.0000 | ORAL_TABLET | ORAL | 0 refills | Status: AC | PRN

## 2019-11-01 MED ORDER — BUPIVACAINE-EPINEPHRINE 0.25% -1:200000 IJ SOLN
INTRAMUSCULAR | Status: DC | PRN
  Administered 2019-11-01: 14 mL

## 2019-11-01 MED ORDER — ROCURONIUM BROMIDE 10 MG/ML (PF) SYRINGE
PREFILLED_SYRINGE | INTRAVENOUS | Status: AC
  Filled 2019-11-01: qty 10

## 2019-11-01 MED ORDER — LIDOCAINE 2% (20 MG/ML) 5 ML SYRINGE
INTRAMUSCULAR | Status: DC | PRN
  Administered 2019-11-01: 60 mg via INTRAVENOUS

## 2019-11-01 MED ORDER — ONDANSETRON HCL 4 MG/2ML IJ SOLN
INTRAMUSCULAR | Status: DC | PRN
  Administered 2019-11-01: 4 mg via INTRAVENOUS

## 2019-11-01 MED ORDER — ONDANSETRON HCL 4 MG/2ML IJ SOLN
INTRAMUSCULAR | Status: AC
  Filled 2019-11-01: qty 2

## 2019-11-01 SURGICAL SUPPLY — 41 items
CABLE HIGH FREQUENCY MONO STRZ (ELECTRODE) ×3 IMPLANT
CATH ROBINSON RED A/P 18FR (CATHETERS) IMPLANT
CONT SPEC 4OZ CLIKSEAL STRL BL (MISCELLANEOUS) IMPLANT
COVER MAYO STAND STRL (DRAPES) ×3 IMPLANT
COVER SURGICAL LIGHT HANDLE (MISCELLANEOUS) ×3 IMPLANT
COVER WAND RF STERILE (DRAPES) ×3 IMPLANT
DERMABOND ADVANCED (GAUZE/BANDAGES/DRESSINGS) ×1
DERMABOND ADVANCED .7 DNX12 (GAUZE/BANDAGES/DRESSINGS) ×2 IMPLANT
DRSG COVADERM PLUS 2X2 (GAUZE/BANDAGES/DRESSINGS) IMPLANT
DRSG OPSITE POSTOP 3X4 (GAUZE/BANDAGES/DRESSINGS) IMPLANT
DURAPREP 26ML APPLICATOR (WOUND CARE) ×3 IMPLANT
ELECT REM PT RETURN 9FT ADLT (ELECTROSURGICAL) ×3
ELECTRODE REM PT RTRN 9FT ADLT (ELECTROSURGICAL) ×2 IMPLANT
GAUZE 4X4 16PLY RFD (DISPOSABLE) ×3 IMPLANT
GLOVE BIO SURGEON STRL SZ8 (GLOVE) ×6 IMPLANT
GOWN STRL REUS W/TWL LRG LVL3 (GOWN DISPOSABLE) ×3 IMPLANT
MANIPULATOR UTERINE 4.5 ZUMI (MISCELLANEOUS) ×3 IMPLANT
NEEDLE INSUFFLATION 120MM (ENDOMECHANICALS) ×3 IMPLANT
NEEDLE SPNL 22GX7 QUINCKE BK (NEEDLE) ×3 IMPLANT
PACK LAPAROSCOPY BASIN (CUSTOM PROCEDURE TRAY) ×3 IMPLANT
PACK TRENDGUARD 450 HYBRID PRO (MISCELLANEOUS) ×2 IMPLANT
SEPRAFILM MEMBRANE 5X6 (MISCELLANEOUS) IMPLANT
SET IRRIG TUBING LAPAROSCOPIC (IRRIGATION / IRRIGATOR) ×3 IMPLANT
SET TUBE SMOKE EVAC HIGH FLOW (TUBING) ×3 IMPLANT
SHEARS HARMONIC ACE PLUS 36CM (ENDOMECHANICALS) ×3 IMPLANT
SUT MNCRL AB 4-0 PS2 18 (SUTURE) ×3 IMPLANT
SUT PROLENE 5 0 P 3 (SUTURE) ×3 IMPLANT
SUT SILK 3 0 SH 30 (SUTURE) ×3 IMPLANT
SYR 30ML LL (SYRINGE) ×3 IMPLANT
SYR 50ML LL SCALE MARK (SYRINGE) IMPLANT
SYR 5ML LL (SYRINGE) ×3 IMPLANT
SYR CONTROL 10ML LL (SYRINGE) IMPLANT
SYR TOOMEY IRRIG 70ML (MISCELLANEOUS) ×3
SYRINGE TOOMEY IRRIG 70ML (MISCELLANEOUS) ×2 IMPLANT
SYS BAG RETRIEVAL 10MM (BASKET)
SYSTEM BAG RETRIEVAL 10MM (BASKET) IMPLANT
TOWEL OR 17X26 10 PK STRL BLUE (TOWEL DISPOSABLE) ×3 IMPLANT
TRAY FOLEY W/BAG SLVR 14FR LF (SET/KITS/TRAYS/PACK) ×3 IMPLANT
TRENDGUARD 450 HYBRID PRO PACK (MISCELLANEOUS) ×3
TROCAR OPTI TIP 5M 100M (ENDOMECHANICALS) ×6 IMPLANT
WARMER LAPAROSCOPE (MISCELLANEOUS) ×3 IMPLANT

## 2019-11-01 NOTE — Anesthesia Procedure Notes (Signed)
Procedure Name: Intubation Date/Time: 11/01/2019 2:43 PM Performed by: Niel Hummer, CRNA Pre-anesthesia Checklist: Patient identified, Emergency Drugs available, Suction available and Patient being monitored Patient Re-evaluated:Patient Re-evaluated prior to induction Oxygen Delivery Method: Circle system utilized Preoxygenation: Pre-oxygenation with 100% oxygen Induction Type: IV induction Ventilation: Mask ventilation without difficulty Laryngoscope Size: Mac and 4 Grade View: Grade I Tube type: Oral Tube size: 7.0 mm Number of attempts: 1 Airway Equipment and Method: Stylet Placement Confirmation: ETT inserted through vocal cords under direct vision,  positive ETCO2 and breath sounds checked- equal and bilateral Secured at: 23 cm Tube secured with: Tape Dental Injury: Teeth and Oropharynx as per pre-operative assessment

## 2019-11-01 NOTE — Op Note (Signed)
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    Procedure: Laparoscopy, lysis of adhesions, right fimbrioplasty, left salpingectomy, chromotubation, repair of rectosigmoid serosa  Anesthesia: Gen. endotracheal  Complications: None  Estimated blood loss: <10 cc  Specimens: Left hydrosalpinx, pelvic adhesions to pathology  Findings: On exam under anesthesia, external genitalia, Bartholin's, Skene's, urethra, and vagina were normal.  The cervix was grossly normal.  Uterus sounded to 7 cm.  Corpus was anteverted, mobile, and normal sized.  There were no adnexal masses palpable.  There was no posterior cul-de-sac nodularity palpable.   On laparoscopy, upper abdomen, liver surface and diaphragm surfaces were normal. Gallbladder was not visualized and appendix appeared normal.  There were filmy adhesions in the left posterior cul-de-sac as well as filmy adhesions covering rectosigmoid, posterior aspect of the cul-de-sac, 30% of the left ovary and the pelvic sidewall.    There were also filmy adhesions covering 20% of the right ovary and 10% of the right tube.  Moreover the right tube was fixated with filmy adhesions against the sigmoid colons serosa and, thus it was pulled away from the right ovary.   The left ovary was otherwise normal.  The left tube showed 1.5 cm wide hydrosalpinx with medium thickness walls and it appeared convoluted.   Chromotubation showed filling of the left hydrosalpinx without spillage, however the right tube did not despite clear opacification during her recent hysterosalpingogram.  Therefore we thought this may have been an artifact. Therefore decision was made to perform a repair on the right tube and remove the left tube. The right ovary appeared otherwise normal. There was  no peritoneal lesions of endometriosis.   Description of the procedure: The patient was placed in dorsal supine position and general endotracheal anesthesia was given. 2 g of cefazolin were given intravenously for prophylaxis. Patient was placed in lithotomy position. She was prepped and draped inside manner.  A Foley catheter was inserted into the bladder. A ZUMI catheter was placed into the uterine cavity. The surgeon was regloved and a surgical field was created on the abdomen.  After preemptive anesthesia of all surgical sites with 0.25% bupivacaine with 1 200,000 epinephrine, a 5 mm intraumbilical skin incision was made and a Verress needle was inserted. Its correct location was confirmed. A pneumoperitoneum was created with carbon dioxide.  5 mm laparoscope with a 30 lens was inserted and video laparoscopy was started . A left lower quadrant 5 mm and a right lower quadrant  5 mm incisions were made and ancillary trochars were placed under direct visualization. Above findings were noted.   Using a needle electrode on 78 W of cutting current, all of the left periovarian adhesions and the posterior cul-de-sac adhesions were excised.  The left peritubal adhesions were also lysed. And left salpingectomy was started by transecting the tube at the uterotubal junction with harmonic Ace.  Then harmonic Ace was applied close to the tube successively and clamped and coagulated and cut the mesosalpinx until the left tube was removed and taken out of the abdomen. The right tubal repair was performed in the following manner: First dilute vasopressin (20 units in 50 mL of saline) was injected into the right mesosalpinx until the tube blanched.  Then the filmy adhesions between the mid segment of the right tube and the ovary were freed up and the right ovary was also lysed free  of adhesions.  The distal end of the compromised tube was carefully separated from its adhesions to the sigmoid with needle tip  electrode at 25 W of cutting current, where needed, 15 W of coagulating current was applied for hemostasis. We then made a 1 cm linear salpingotomy on the antimesosalpingeal border of the phimotic tube while spreading the tubal lumen open over atraumatic Semm graspers. Hook scissors were used to carry the incision into the tubal lumen, creating two distal tubal flaps.  We then used 5-0 Prolene interrupted sutures and intracorporeal knot tying to evert each flap onto the corresponding region on the ampullary serosa to keep the tube open.   Hemostasis on the right tube was achieved with needle electrode.  Pelvis was copiously irrigated and aspirated.  The instrument and lap pad count was correct.  The trochars were removed and the gas was allowed to escape. The incisions were approximated with Dermabond.The patient tolerated the procedure well and was transferred to recovery room in satisfactory condition. The findings were discussed with the patient's husband in the waiting room and a postop repeat HSG was recommended if she does not conceive after the next cycle with timed intercourse.  Fermin Schwab, MD

## 2019-11-01 NOTE — H&P (Signed)
Erika White is a 29 y.o. female , originally referred to me by Dr., for longstanding infertility.  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  Patient describes left lower quadrant abdominal pain.  Patient denies STIs or dysmenorrhea or dyspareunia. Patient would like to preserve her childbearing potential.  Pertinent Gynecological History: Menses: flow is normal  Contraception: none DES exposure: denies Blood transfusions: none Sexually transmitted diseases: no past history Previous GYN Procedures: none  Last mammogram: normal Last pap: normal    Menstrual History: Menarche age: 31 No LMP recorded.    Past Medical History:  Diagnosis Date  . Chronic salpingitis   . GERD (gastroesophageal reflux disease)   . Patellar tendonitis    knee brace prn left knee                    Past Surgical History:  Procedure Laterality Date  . EYE SURGERY  09/07/2019   lasix  . NO PAST SURGERIES               Family History  Problem Relation Age of Onset  . Thyroid disease Mother   . Pulmonary fibrosis Mother        lung transplants  . Heart disease Father 45       heart disease in his 44s  . Heart attack Father   . Diabetes Father   . Cancer Maternal Grandmother        lung and breast  . Cancer Maternal Grandfather   . Cancer Paternal Grandmother        mesothelioma  . Cancer Paternal Grandfather        lung   No hereditary disease.  No cancer of breast, ovary, uterus. No cutaneous leiomyomatosis or renal cell carcinoma.  Social History   Socioeconomic History  . Marital status: Married    Spouse name: Not on file  . Number of children: Not on file  . Years of education: Not on file  . Highest education level: Not on file  Occupational History  . Not on file  Tobacco Use  . Smoking status: Former Smoker    Packs/day: 0.25    Years: 5.00    Pack years: 1.25    Types: Cigarettes     Start date: 10/05/2006  . Smokeless tobacco: Never Used  . Tobacco comment: quit 2018  Substance and Sexual Activity  . Alcohol use: Yes    Comment: occ  . Drug use: No  . Sexual activity: Yes    Birth control/protection: None  Other Topics Concern  . Not on file  Social History Narrative  . Not on file   Social Determinants of Health   Financial Resource Strain:   . Difficulty of Paying Living Expenses: Not on file  Food Insecurity:   . Worried About Programme researcher, broadcasting/film/video in the Last Year: Not on file  . Ran Out of Food in the Last Year: Not on file  Transportation Needs:   . Lack of Transportation (Medical): Not on file  . Lack of Transportation (Non-Medical): Not on file  Physical Activity:   . Days of Exercise per Week: Not on file  . Minutes of Exercise per Session: Not on file  Stress:   . Feeling of Stress : Not on file  Social Connections:   . Frequency of Communication with Friends and Family: Not on file  . Frequency of Social Gatherings with Friends and  Family: Not on file  . Attends Religious Services: Not on file  . Active Member of Clubs or Organizations: Not on file  . Attends Archivist Meetings: Not on file  . Marital Status: Not on file  Intimate Partner Violence:   . Fear of Current or Ex-Partner: Not on file  . Emotionally Abused: Not on file  . Physically Abused: Not on file  . Sexually Abused: Not on file    No Known Allergies  No current facility-administered medications on file prior to encounter.   Current Outpatient Medications on File Prior to Encounter  Medication Sig Dispense Refill  . carboxymethylcellulose (REFRESH PLUS) 0.5 % SOLN 1 drop 3 (three) times daily as needed.    Marland Kitchen ibuprofen (ADVIL) 200 MG tablet Take 200 mg by mouth every 6 (six) hours as needed. 800 mg daily    . ketorolac (TORADOL) 10 MG tablet Take 10 mg by mouth once.       Review of Systems  Constitutional: Negative.   HENT: Negative.   Eyes: Negative.    Respiratory: Negative.   Cardiovascular: Negative.   Gastrointestinal: Negative.   Genitourinary: Negative.   Musculoskeletal: Negative.   Skin: Negative.   Neurological: Negative.   Endo/Heme/Allergies: Negative.   Psychiatric/Behavioral: Negative.      Physical Exam  BP 136/79   Pulse 81   Temp 98.2 F (36.8 C) (Oral)   Resp 18   Ht 5\' 5"  (1.651 m)   Wt 100.8 kg   LMP 10/13/2019   SpO2 98%   BMI 36.99 kg/m  Constitutional: She is oriented to person, place, and time. She appears well-developed and well-nourished.  HENT:  Head: Normocephalic and atraumatic.  Nose: Nose normal.  Mouth/Throat: Oropharynx is clear and moist. No oropharyngeal exudate.  Eyes: Conjunctivae normal and EOM are normal. Pupils are equal, round, and reactive to light. No scleral icterus.  Neck: Normal range of motion. Neck supple. No tracheal deviation present. No thyromegaly present.  Cardiovascular: Normal rate.   Respiratory: Effort normal and breath sounds normal.  GI: Soft. Bowel sounds are normal. She exhibits no distension and no mass. There is no tenderness.  Lymphadenopathy:    She has no cervical adenopathy.  Neurological: She is alert and oriented to person, place, and time. She has normal reflexes.  Skin: Skin is warm.  Psychiatric: She has a normal mood and affect. Her behavior is normal. Judgment and thought content normal.     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  Preoperative for laparoscopy, lysis of adhesions, probable removal or repair of the left hydrosalpinx with probable repair or removal of the right distally occluded tube. Benefits and risks of proposed procedures were discussed with the patient and her family member again.  Bowel prep instructions were given.  All of patient's questions were answered.  She verbalized understanding.  She knows that if the tubal repair is possible and she  conceives, there is a 20 to 40% risk of an ectopic pregnancy.  Governor Specking, MD

## 2019-11-01 NOTE — Discharge Instructions (Signed)
DISCHARGE INSTRUCTIONS: Laparoscopy  The following instructions have been prepared to help you care for yourself upon your return home today.  Wound care: Marland Kitchen Do not get the incision wet for the first 24 hours. The incision should be kept clean and dry. . The Band-Aids or dressings may be removed the day after surgery. . Should the incision become sore, red, and swollen after the first week, check with your doctor.  Personal hygiene: . Shower the day after your procedure.  Activity and limitations: . Do NOT drive or operate any equipment today. . Do NOT lift anything more than 15 pounds for 2-3 weeks after surgery. . Do NOT rest in bed all day. . Walking is encouraged. Walk each day, starting slowly with 5-minute walks 3 or 4 times a day. Slowly increase the length of your walks. . Walk up and down stairs slowly. . Do NOT do strenuous activities, such as golfing, playing tennis, bowling, running, biking, weight lifting, gardening, mowing, or vacuuming for 2-4 weeks. Ask your doctor when it is okay to start.  Diet: Eat a light meal as desired this evening. You may resume your usual diet tomorrow.  Return to work: This is dependent on the type of work you do. For the most part you can return to a desk job within a week of surgery. If you are more active at work, please discuss this with your doctor.  What to expect after your surgery: You may have a slight burning sensation when you urinate on the first day. You may have a very small amount of blood in the urine. Expect to have a small amount of vaginal discharge/light bleeding for 1-2 weeks. It is not unusual to have abdominal soreness and bruising for up to 2 weeks. You may be tired and need more rest for about 1 week. You may experience shoulder pain for 24-72 hours. Lying flat in bed may relieve it.  Call your doctor for any of the following: . Develop a fever of 100.4 or greater . Inability to urinate 6 hours after discharge from  hospital . Severe pain not relieved by pain medications . Persistent of heavy bleeding at incision site . Redness or swelling around incision site after a week . Increasing nausea or vomiting   Post Anesthesia Home Care Instructions  Activity: Get plenty of rest for the remainder of the day. A responsible individual must stay with you for 24 hours following the procedure.  For the next 24 hours, DO NOT: -Drive a car -Paediatric nurse -Drink alcoholic beverages -Take any medication unless instructed by your physician -Make any legal decisions or sign important papers.  Meals: Start with liquid foods such as gelatin or soup. Progress to regular foods as tolerated. Avoid greasy, spicy, heavy foods. If nausea and/or vomiting occur, drink only clear liquids until the nausea and/or vomiting subsides. Call your physician if vomiting continues.  Special Instructions/Symptoms: Your throat may feel dry or sore from the anesthesia or the breathing tube placed in your throat during surgery. If this causes discomfort, gargle with warm salt water. The discomfort should disappear within 24 hours.  DISCHARGE INSTRUCTIONS: HYSTEROSCOPY / ENDOMETRIAL ABLATION The following instructions have been prepared to help you care for yourself upon your return home.  May take Ibuprofen after  May take stool softner while taking narcotic pain medication to prevent constipation.  Drink plenty of water.  Personal hygiene: Marland Kitchen Use sanitary pads for vaginal drainage, not tampons. . Shower the day after your procedure. Marland Kitchen  NO tub baths, pools or Jacuzzis for 2-3 weeks. . Wipe front to back after using the bathroom.  Activity and limitations: . Do NOT drive or operate any equipment for 24 hours. The effects of anesthesia are still present and drowsiness may result. . Do NOT rest in bed all day. . Walking is encouraged. . Walk up and down stairs slowly. . You may resume your normal activity in one to two days or  as indicated by your physician. Sexual activity: NO intercourse for at least 2 weeks after the procedure, or as indicated by your Doctor.  Diet: Eat a light meal as desired this evening. You may resume your usual diet tomorrow.  Return to Work: You may resume your work activities in one to two days or as indicated by Therapist, sports.  What to expect after your surgery: Expect to have vaginal bleeding/discharge for 2-3 days and spotting for up to 10 days. It is not unusual to have soreness for up to 1-2 weeks. You may have a slight burning sensation when you urinate for the first day. Mild cramps may continue for a couple of days. You may have a regular period in 2-6 weeks.  Call your doctor for any of the following: . Excessive vaginal bleeding or clotting, saturating and changing one pad every hour. . Inability to urinate 6 hours after discharge from hospital. . Pain not relieved by pain medication. . Fever of 100.4 F or greater. . Unusual vaginal discharge or odor.

## 2019-11-01 NOTE — Anesthesia Preprocedure Evaluation (Signed)
Anesthesia Evaluation  Patient identified by MRN, date of birth, ID band Patient awake    Reviewed: Allergy & Precautions, NPO status , Patient's Chart, lab work & pertinent test results  Airway Mallampati: II  TM Distance: >3 FB Neck ROM: Full    Dental no notable dental hx.    Pulmonary neg pulmonary ROS, former smoker,    Pulmonary exam normal breath sounds clear to auscultation       Cardiovascular negative cardio ROS Normal cardiovascular exam Rhythm:Regular Rate:Normal     Neuro/Psych negative neurological ROS  negative psych ROS   GI/Hepatic Neg liver ROS, GERD  ,  Endo/Other  negative endocrine ROS  Renal/GU negative Renal ROS  negative genitourinary   Musculoskeletal negative musculoskeletal ROS (+)   Abdominal (+) + obese,   Peds negative pediatric ROS (+)  Hematology negative hematology ROS (+)   Anesthesia Other Findings   Reproductive/Obstetrics negative OB ROS                             Anesthesia Physical Anesthesia Plan  ASA: II  Anesthesia Plan: General   Post-op Pain Management:    Induction: Intravenous  PONV Risk Score and Plan: 3 and Ondansetron, Dexamethasone, Midazolam and Treatment may vary due to age or medical condition  Airway Management Planned: Oral ETT  Additional Equipment:   Intra-op Plan:   Post-operative Plan: Extubation in OR  Informed Consent: I have reviewed the patients History and Physical, chart, labs and discussed the procedure including the risks, benefits and alternatives for the proposed anesthesia with the patient or authorized representative who has indicated his/her understanding and acceptance.     Dental advisory given  Plan Discussed with: CRNA  Anesthesia Plan Comments:         Anesthesia Quick Evaluation

## 2019-11-01 NOTE — Transfer of Care (Signed)
Immediate Anesthesia Transfer of Care Note  Patient: Erika White  Procedure(s) Performed: LAPAROSCOPIC LYSIS OF ADHESIONS, RIGHT FIMBRIOPLASTY, CHROMOTUBATION, REPAIR OF RECTOSIGMOID SEROSA (N/A ) LAPAROSCOPIC LEFT SALPINGECTOMY (Left )  Patient Location: PACU  Anesthesia Type:General  Level of Consciousness: drowsy, patient cooperative and responds to stimulation  Airway & Oxygen Therapy: Patient Spontanous Breathing and Patient connected to face mask oxygen  Post-op Assessment: Report given to RN and Post -op Vital signs reviewed and stable  Post vital signs: Reviewed and stable  Last Vitals:  Vitals Value Taken Time  BP 132/50 11/01/19 1647  Temp    Pulse 98 11/01/19 1648  Resp 25 11/01/19 1648  SpO2 98 % 11/01/19 1648  Vitals shown include unvalidated device data.  Last Pain:  Vitals:   11/01/19 1216  TempSrc: Oral         Complications: No apparent anesthesia complications

## 2019-11-03 ENCOUNTER — Encounter: Admit: 2019-11-03 | Discharge: 2019-11-04 | Payer: BLUE CROSS/BLUE SHIELD | Attending: Family | Primary: Family

## 2019-11-03 DIAGNOSIS — S83242A Other tear of medial meniscus, current injury, left knee, initial encounter: Principal | ICD-10-CM

## 2019-11-03 DIAGNOSIS — S83512A Sprain of anterior cruciate ligament of left knee, initial encounter: Principal | ICD-10-CM

## 2019-11-03 LAB — SURGICAL PATHOLOGY

## 2019-11-07 NOTE — Anesthesia Postprocedure Evaluation (Signed)
Anesthesia Post Note  Patient: Erika White  Procedure(s) Performed: LAPAROSCOPIC LYSIS OF ADHESIONS, RIGHT FIMBRIOPLASTY, CHROMOTUBATION, REPAIR OF RECTOSIGMOID SEROSA (N/A ) LAPAROSCOPIC LEFT SALPINGECTOMY (Left )     Patient location during evaluation: PACU Anesthesia Type: General Level of consciousness: awake and alert Pain management: pain level controlled Vital Signs Assessment: post-procedure vital signs reviewed and stable Respiratory status: spontaneous breathing, nonlabored ventilation, respiratory function stable and patient connected to nasal cannula oxygen Cardiovascular status: blood pressure returned to baseline and stable Postop Assessment: no apparent nausea or vomiting Anesthetic complications: no    Last Vitals:  Vitals:   11/01/19 1730 11/01/19 1800  BP: 124/64 114/67  Pulse: 83 96  Resp: 16 16  Temp:  37.6 C  SpO2: 96% 94%    Last Pain:  Vitals:   11/02/19 1035  TempSrc:   PainSc: 3                  [name] S

## 2019-11-20 ENCOUNTER — Encounter: Admit: 2019-11-20 | Discharge: 2019-11-21 | Payer: PRIVATE HEALTH INSURANCE | Attending: Family | Primary: Family

## 2019-11-20 DIAGNOSIS — S83512D Sprain of anterior cruciate ligament of left knee, subsequent encounter: Principal | ICD-10-CM

## 2019-11-20 DIAGNOSIS — S83242D Other tear of medial meniscus, current injury, left knee, subsequent encounter: Principal | ICD-10-CM

## 2019-12-04 ENCOUNTER — Encounter
Admit: 2019-12-04 | Discharge: 2019-12-05 | Payer: BLUE CROSS/BLUE SHIELD | Attending: Sports Medicine | Primary: Sports Medicine

## 2019-12-04 DIAGNOSIS — S83512D Sprain of anterior cruciate ligament of left knee, subsequent encounter: Principal | ICD-10-CM

## 2019-12-04 DIAGNOSIS — S83242D Other tear of medial meniscus, current injury, left knee, subsequent encounter: Principal | ICD-10-CM

## 2019-12-22 ENCOUNTER — Encounter: Admit: 2019-12-22 | Discharge: 2019-12-23 | Payer: BLUE CROSS/BLUE SHIELD

## 2019-12-22 ENCOUNTER — Encounter
Admit: 2019-12-22 | Discharge: 2019-12-22 | Payer: BLUE CROSS/BLUE SHIELD | Attending: Orthopaedic Surgery | Primary: Orthopaedic Surgery

## 2019-12-22 DIAGNOSIS — Z01818 Encounter for other preprocedural examination: Principal | ICD-10-CM

## 2019-12-25 MED ORDER — OXYCODONE-ACETAMINOPHEN 5 MG-325 MG TABLET
ORAL_TABLET | Freq: Four times a day (QID) | ORAL | 0 refills | 7 days | Status: CP | PRN
Start: 2019-12-25 — End: ?

## 2019-12-26 ENCOUNTER — Encounter: Admit: 2019-12-26 | Discharge: 2019-12-27 | Payer: BLUE CROSS/BLUE SHIELD

## 2019-12-29 DIAGNOSIS — S83242D Other tear of medial meniscus, current injury, left knee, subsequent encounter: Principal | ICD-10-CM

## 2019-12-29 DIAGNOSIS — S83512D Sprain of anterior cruciate ligament of left knee, subsequent encounter: Principal | ICD-10-CM

## 2019-12-29 MED ORDER — OXYCODONE-ACETAMINOPHEN 5 MG-325 MG TABLET
ORAL_TABLET | Freq: Four times a day (QID) | ORAL | 0 refills | 7.00000 days | Status: CP | PRN
Start: 2019-12-29 — End: 2019-12-29

## 2019-12-29 MED ORDER — OXYCODONE-ACETAMINOPHEN 5 MG-325 MG TABLET: 1 | tablet | Freq: Four times a day (QID) | 0 refills | 7 days | Status: AC

## 2020-01-08 ENCOUNTER — Ambulatory Visit
Admit: 2020-01-08 | Discharge: 2020-01-09 | Payer: BLUE CROSS/BLUE SHIELD | Attending: Sports Medicine | Primary: Sports Medicine

## 2020-01-08 DIAGNOSIS — S83512D Sprain of anterior cruciate ligament of left knee, subsequent encounter: Principal | ICD-10-CM

## 2020-01-08 DIAGNOSIS — S83242D Other tear of medial meniscus, current injury, left knee, subsequent encounter: Principal | ICD-10-CM

## 2020-01-08 MED ORDER — OXYCODONE-ACETAMINOPHEN 5 MG-325 MG TABLET: 2 | tablet | Freq: Four times a day (QID) | 0 refills | 4 days | Status: AC

## 2020-01-08 MED ORDER — OXYCODONE-ACETAMINOPHEN 5 MG-325 MG TABLET
ORAL_TABLET | Freq: Four times a day (QID) | ORAL | 0 refills | 5.00000 days | Status: CP | PRN
Start: 2020-01-08 — End: 2020-01-15

## 2020-01-10 MED ORDER — OXYCODONE-ACETAMINOPHEN 5 MG-325 MG TABLET
ORAL_TABLET | Freq: Four times a day (QID) | ORAL | 0 refills | 2 days | Status: CP | PRN
Start: 2020-01-10 — End: ?

## 2020-01-22 MED ORDER — OXYCODONE-ACETAMINOPHEN 5 MG-325 MG TABLET
ORAL_TABLET | Freq: Four times a day (QID) | ORAL | 0 refills | 5.00000 days | Status: CP | PRN
Start: 2020-01-22 — End: ?

## 2020-02-06 MED ORDER — OXYCODONE-ACETAMINOPHEN 5 MG-325 MG TABLET
ORAL_TABLET | Freq: Four times a day (QID) | ORAL | 0 refills | 5 days | Status: CP | PRN
Start: 2020-02-06 — End: ?

## 2020-02-08 ENCOUNTER — Ambulatory Visit
Admit: 2020-02-08 | Discharge: 2020-02-09 | Payer: BLUE CROSS/BLUE SHIELD | Attending: Sports Medicine | Primary: Sports Medicine

## 2020-02-08 DIAGNOSIS — Z9889 Other specified postprocedural states: Principal | ICD-10-CM

## 2020-02-20 ENCOUNTER — Encounter: Admit: 2020-02-20 | Discharge: 2020-03-20 | Payer: BLUE CROSS/BLUE SHIELD

## 2020-02-20 ENCOUNTER — Ambulatory Visit: Admit: 2020-02-20 | Discharge: 2020-03-20 | Payer: BLUE CROSS/BLUE SHIELD

## 2020-02-20 DIAGNOSIS — S83512D Sprain of anterior cruciate ligament of left knee, subsequent encounter: Principal | ICD-10-CM

## 2020-02-20 DIAGNOSIS — M25562 Pain in left knee: Principal | ICD-10-CM

## 2020-02-22 DIAGNOSIS — S83512D Sprain of anterior cruciate ligament of left knee, subsequent encounter: Principal | ICD-10-CM

## 2020-02-22 DIAGNOSIS — M25562 Pain in left knee: Principal | ICD-10-CM

## 2020-02-27 DIAGNOSIS — M25562 Pain in left knee: Principal | ICD-10-CM

## 2020-02-29 DIAGNOSIS — S83512D Sprain of anterior cruciate ligament of left knee, subsequent encounter: Principal | ICD-10-CM

## 2020-03-05 DIAGNOSIS — S83512D Sprain of anterior cruciate ligament of left knee, subsequent encounter: Principal | ICD-10-CM

## 2020-03-07 DIAGNOSIS — M25562 Pain in left knee: Principal | ICD-10-CM

## 2020-03-07 DIAGNOSIS — S83512D Sprain of anterior cruciate ligament of left knee, subsequent encounter: Principal | ICD-10-CM

## 2020-03-12 DIAGNOSIS — S83512D Sprain of anterior cruciate ligament of left knee, subsequent encounter: Principal | ICD-10-CM

## 2020-03-12 DIAGNOSIS — M25562 Pain in left knee: Principal | ICD-10-CM

## 2020-03-14 DIAGNOSIS — M25562 Pain in left knee: Principal | ICD-10-CM

## 2020-03-14 DIAGNOSIS — S83512D Sprain of anterior cruciate ligament of left knee, subsequent encounter: Principal | ICD-10-CM

## 2020-03-18 ENCOUNTER — Ambulatory Visit
Admit: 2020-03-18 | Discharge: 2020-03-19 | Payer: BLUE CROSS/BLUE SHIELD | Attending: Sports Medicine | Primary: Sports Medicine

## 2020-03-18 DIAGNOSIS — Z9889 Other specified postprocedural states: Principal | ICD-10-CM

## 2020-03-22 ENCOUNTER — Ambulatory Visit: Admit: 2020-03-22 | Discharge: 2020-04-19 | Payer: BLUE CROSS/BLUE SHIELD

## 2020-03-22 DIAGNOSIS — M25562 Pain in left knee: Principal | ICD-10-CM

## 2020-03-22 DIAGNOSIS — S83512D Sprain of anterior cruciate ligament of left knee, subsequent encounter: Principal | ICD-10-CM

## 2020-03-26 DIAGNOSIS — S83512D Sprain of anterior cruciate ligament of left knee, subsequent encounter: Principal | ICD-10-CM

## 2020-03-26 DIAGNOSIS — M25562 Pain in left knee: Principal | ICD-10-CM

## 2020-04-02 DIAGNOSIS — S83512D Sprain of anterior cruciate ligament of left knee, subsequent encounter: Principal | ICD-10-CM

## 2020-04-02 DIAGNOSIS — M25562 Pain in left knee: Principal | ICD-10-CM

## 2020-08-06 ENCOUNTER — Encounter: Admit: 2020-08-06 | Discharge: 2020-08-07 | Payer: PRIVATE HEALTH INSURANCE | Attending: Family | Primary: Family

## 2020-08-06 DIAGNOSIS — S83512D Sprain of anterior cruciate ligament of left knee, subsequent encounter: Principal | ICD-10-CM

## 2020-08-06 DIAGNOSIS — Z9889 Other specified postprocedural states: Principal | ICD-10-CM

## 2020-08-06 DIAGNOSIS — Z6835 Body mass index (BMI) 35.0-35.9, adult: Secondary | ICD-10-CM | POA: Diagnosis not present

## 2020-08-20 DIAGNOSIS — R1013 Epigastric pain: Secondary | ICD-10-CM | POA: Diagnosis not present

## 2020-08-20 DIAGNOSIS — R109 Unspecified abdominal pain: Secondary | ICD-10-CM | POA: Diagnosis not present

## 2020-08-20 DIAGNOSIS — K219 Gastro-esophageal reflux disease without esophagitis: Secondary | ICD-10-CM | POA: Diagnosis not present

## 2020-08-20 DIAGNOSIS — R197 Diarrhea, unspecified: Secondary | ICD-10-CM | POA: Diagnosis not present

## 2020-08-20 DIAGNOSIS — Z13220 Encounter for screening for lipoid disorders: Secondary | ICD-10-CM | POA: Diagnosis not present

## 2020-08-20 DIAGNOSIS — Z6836 Body mass index (BMI) 36.0-36.9, adult: Secondary | ICD-10-CM | POA: Diagnosis not present

## 2020-08-20 DIAGNOSIS — K273 Acute peptic ulcer, site unspecified, without hemorrhage or perforation: Secondary | ICD-10-CM | POA: Diagnosis not present
